# Patient Record
Sex: Female | Born: 1980 | Race: Black or African American | Hispanic: No | Marital: Single | Smoking: Never smoker
Health system: Southern US, Community
[De-identification: ages and names within clinical notes are randomized; demographics above are authoritative.]

## PROBLEM LIST (undated history)

## (undated) DIAGNOSIS — R51 Headache: Secondary | ICD-10-CM

## (undated) DIAGNOSIS — R519 Headache, unspecified: Secondary | ICD-10-CM

## (undated) DIAGNOSIS — E039 Hypothyroidism, unspecified: Secondary | ICD-10-CM

## (undated) DIAGNOSIS — G709 Myoneural disorder, unspecified: Secondary | ICD-10-CM

## (undated) DIAGNOSIS — J45909 Unspecified asthma, uncomplicated: Secondary | ICD-10-CM

## (undated) HISTORY — PX: NO PAST SURGERIES: SHX2092

---

## 2002-11-19 ENCOUNTER — Emergency Department (HOSPITAL_COMMUNITY): Admission: EM | Admit: 2002-11-19 | Discharge: 2002-11-19 | Payer: Self-pay | Admitting: Emergency Medicine

## 2003-02-25 ENCOUNTER — Emergency Department (HOSPITAL_COMMUNITY): Admission: EM | Admit: 2003-02-25 | Discharge: 2003-02-25 | Payer: Self-pay | Admitting: Emergency Medicine

## 2003-02-25 ENCOUNTER — Encounter: Payer: Self-pay | Admitting: Emergency Medicine

## 2003-03-26 ENCOUNTER — Encounter: Admission: RE | Admit: 2003-03-26 | Discharge: 2003-05-05 | Payer: Self-pay | Admitting: *Deleted

## 2004-07-07 ENCOUNTER — Other Ambulatory Visit: Admission: RE | Admit: 2004-07-07 | Discharge: 2004-07-07 | Payer: Self-pay | Admitting: Family Medicine

## 2004-08-28 ENCOUNTER — Emergency Department (HOSPITAL_COMMUNITY): Admission: EM | Admit: 2004-08-28 | Discharge: 2004-08-28 | Payer: Self-pay | Admitting: Family Medicine

## 2005-07-18 ENCOUNTER — Other Ambulatory Visit: Admission: RE | Admit: 2005-07-18 | Discharge: 2005-07-18 | Payer: Self-pay | Admitting: Family Medicine

## 2006-07-22 ENCOUNTER — Other Ambulatory Visit: Admission: RE | Admit: 2006-07-22 | Discharge: 2006-07-22 | Payer: Self-pay | Admitting: Family Medicine

## 2007-07-25 ENCOUNTER — Other Ambulatory Visit: Admission: RE | Admit: 2007-07-25 | Discharge: 2007-07-25 | Payer: Self-pay | Admitting: Family Medicine

## 2008-07-26 ENCOUNTER — Other Ambulatory Visit: Admission: RE | Admit: 2008-07-26 | Discharge: 2008-07-26 | Payer: Self-pay | Admitting: Family Medicine

## 2009-09-12 ENCOUNTER — Other Ambulatory Visit: Admission: RE | Admit: 2009-09-12 | Discharge: 2009-09-12 | Payer: Self-pay | Admitting: Family Medicine

## 2010-09-25 ENCOUNTER — Other Ambulatory Visit: Payer: Self-pay | Admitting: Family Medicine

## 2010-09-25 ENCOUNTER — Other Ambulatory Visit (HOSPITAL_COMMUNITY)
Admission: RE | Admit: 2010-09-25 | Discharge: 2010-09-25 | Disposition: A | Discharge status: Home / Self Care | Payer: Self-pay | Source: Ambulatory Visit | Attending: Family Medicine | Admitting: Family Medicine

## 2010-09-25 DIAGNOSIS — Z01419 Encounter for gynecological examination (general) (routine) without abnormal findings: Secondary | ICD-10-CM | POA: Insufficient documentation

## 2011-10-22 ENCOUNTER — Other Ambulatory Visit: Payer: Self-pay | Admitting: Family Medicine

## 2011-10-22 ENCOUNTER — Other Ambulatory Visit (HOSPITAL_COMMUNITY)
Admission: RE | Admit: 2011-10-22 | Discharge: 2011-10-22 | Disposition: A | Discharge status: Home / Self Care | Payer: BC Managed Care – PPO | Source: Ambulatory Visit | Attending: Family Medicine | Admitting: Family Medicine

## 2011-10-22 DIAGNOSIS — Z01419 Encounter for gynecological examination (general) (routine) without abnormal findings: Secondary | ICD-10-CM | POA: Insufficient documentation

## 2011-10-22 DIAGNOSIS — Z113 Encounter for screening for infections with a predominantly sexual mode of transmission: Secondary | ICD-10-CM | POA: Insufficient documentation

## 2012-10-22 ENCOUNTER — Other Ambulatory Visit: Payer: Self-pay | Admitting: Family Medicine

## 2012-10-22 ENCOUNTER — Other Ambulatory Visit (HOSPITAL_COMMUNITY)
Admission: RE | Admit: 2012-10-22 | Discharge: 2012-10-22 | Disposition: A | Discharge status: Home / Self Care | Payer: Commercial Indemnity | Source: Ambulatory Visit | Attending: Family Medicine | Admitting: Family Medicine

## 2012-10-22 DIAGNOSIS — Z113 Encounter for screening for infections with a predominantly sexual mode of transmission: Secondary | ICD-10-CM | POA: Insufficient documentation

## 2012-10-22 DIAGNOSIS — Z01419 Encounter for gynecological examination (general) (routine) without abnormal findings: Secondary | ICD-10-CM | POA: Insufficient documentation

## 2015-06-02 ENCOUNTER — Encounter (HOSPITAL_COMMUNITY): Payer: Self-pay

## 2015-06-02 ENCOUNTER — Inpatient Hospital Stay (HOSPITAL_COMMUNITY): Payer: BLUE CROSS/BLUE SHIELD

## 2015-06-02 ENCOUNTER — Inpatient Hospital Stay (HOSPITAL_COMMUNITY)
Admission: AD | Admit: 2015-06-02 | Discharge: 2015-06-02 | DRG: 778 | Payer: BLUE CROSS/BLUE SHIELD | Source: Ambulatory Visit | Attending: Obstetrics and Gynecology | Admitting: Obstetrics and Gynecology

## 2015-06-02 DIAGNOSIS — D72829 Elevated white blood cell count, unspecified: Secondary | ICD-10-CM | POA: Diagnosis present

## 2015-06-02 DIAGNOSIS — Z91040 Latex allergy status: Secondary | ICD-10-CM | POA: Diagnosis present

## 2015-06-02 DIAGNOSIS — O99513 Diseases of the respiratory system complicating pregnancy, third trimester: Secondary | ICD-10-CM | POA: Diagnosis present

## 2015-06-02 DIAGNOSIS — E039 Hypothyroidism, unspecified: Secondary | ICD-10-CM | POA: Diagnosis present

## 2015-06-02 DIAGNOSIS — Z3A28 28 weeks gestation of pregnancy: Secondary | ICD-10-CM

## 2015-06-02 DIAGNOSIS — O09513 Supervision of elderly primigravida, third trimester: Secondary | ICD-10-CM | POA: Diagnosis not present

## 2015-06-02 DIAGNOSIS — M6208 Separation of muscle (nontraumatic), other site: Secondary | ICD-10-CM

## 2015-06-02 DIAGNOSIS — O329XX0 Maternal care for malpresentation of fetus, unspecified, not applicable or unspecified: Secondary | ICD-10-CM

## 2015-06-02 DIAGNOSIS — R000 Tachycardia, unspecified: Secondary | ICD-10-CM | POA: Diagnosis present

## 2015-06-02 DIAGNOSIS — O99283 Endocrine, nutritional and metabolic diseases complicating pregnancy, third trimester: Secondary | ICD-10-CM | POA: Diagnosis present

## 2015-06-02 DIAGNOSIS — J45909 Unspecified asthma, uncomplicated: Secondary | ICD-10-CM | POA: Diagnosis not present

## 2015-06-02 DIAGNOSIS — O09529 Supervision of elderly multigravida, unspecified trimester: Secondary | ICD-10-CM

## 2015-06-02 HISTORY — DX: Unspecified asthma, uncomplicated: J45.909

## 2015-06-02 HISTORY — DX: Hypothyroidism, unspecified: E03.9

## 2015-06-02 HISTORY — DX: Myoneural disorder, unspecified: G70.9

## 2015-06-02 HISTORY — DX: Headache: R51

## 2015-06-02 HISTORY — DX: Headache, unspecified: R51.9

## 2015-06-02 LAB — URINALYSIS, ROUTINE W REFLEX MICROSCOPIC
Bilirubin Urine: NEGATIVE
GLUCOSE, UA: NEGATIVE mg/dL
HGB URINE DIPSTICK: NEGATIVE
Ketones, ur: >80 mg/dL — AB
Nitrite: NEGATIVE
PH: 6 (ref 5.0–8.0)
PROTEIN: NEGATIVE mg/dL
Specific Gravity, Urine: >1.03 — ABNORMAL HIGH (ref 1.005–1.030)

## 2015-06-02 LAB — COMPREHENSIVE METABOLIC PANEL
ALK PHOS: 93 U/L (ref 38–126)
ALT: 14 U/L (ref 14–54)
AST: 16 U/L (ref 15–41)
Albumin: 3.1 g/dL — ABNORMAL LOW (ref 3.5–5.0)
Anion gap: 9 (ref 5–15)
BILIRUBIN TOTAL: 0.7 mg/dL (ref 0.3–1.2)
CALCIUM: 9 mg/dL (ref 8.9–10.3)
CO2: 20 mmol/L — ABNORMAL LOW (ref 22–32)
CREATININE: 0.47 mg/dL (ref 0.44–1.00)
Chloride: 105 mmol/L (ref 101–111)
Glucose, Bld: 114 mg/dL — ABNORMAL HIGH (ref 65–99)
Potassium: 3.5 mmol/L (ref 3.5–5.1)
Sodium: 134 mmol/L — ABNORMAL LOW (ref 135–145)
TOTAL PROTEIN: 6.7 g/dL (ref 6.5–8.1)

## 2015-06-02 LAB — URINE MICROSCOPIC-ADD ON: RBC / HPF: NONE SEEN RBC/hpf (ref 0–5)

## 2015-06-02 LAB — WET PREP, GENITAL
SPERM: NONE SEEN
TRICH WET PREP: NONE SEEN
YEAST WET PREP: NONE SEEN

## 2015-06-02 LAB — CBC
HCT: 33.1 % — ABNORMAL LOW (ref 36.0–46.0)
Hemoglobin: 11.4 g/dL — ABNORMAL LOW (ref 12.0–15.0)
MCH: 31.1 pg (ref 26.0–34.0)
MCHC: 34.4 g/dL (ref 30.0–36.0)
MCV: 90.2 fL (ref 78.0–100.0)
PLATELETS: 202 10*3/uL (ref 150–400)
RBC: 3.67 MIL/uL — AB (ref 3.87–5.11)
RDW: 13.8 % (ref 11.5–15.5)
WBC: 19 10*3/uL — AB (ref 4.0–10.5)

## 2015-06-02 LAB — GROUP B STREP BY PCR: GROUP B STREP BY PCR: NEGATIVE

## 2015-06-02 MED ORDER — BETAMETHASONE SOD PHOS & ACET 6 (3-3) MG/ML IJ SUSP
12.0000 mg | Freq: Once | INTRAMUSCULAR | Status: DC
  Filled 2015-06-02: qty 2

## 2015-06-02 MED ORDER — DOCUSATE SODIUM 100 MG PO CAPS: 100.0000 mg | ORAL_CAPSULE | Freq: Every day | ORAL | Status: DC

## 2015-06-02 MED ORDER — CALCIUM CARBONATE ANTACID 500 MG PO CHEW: 2.0000 | CHEWABLE_TABLET | ORAL | Status: DC | PRN

## 2015-06-02 MED ORDER — LACTATED RINGERS IV SOLN: INTRAVENOUS | Status: DC

## 2015-06-02 MED ORDER — ACETAMINOPHEN 325 MG PO TABS: 650.0000 mg | ORAL_TABLET | ORAL | Status: DC | PRN

## 2015-06-02 MED ORDER — BETAMETHASONE SOD PHOS & ACET 6 (3-3) MG/ML IJ SUSP
12.0000 mg | Freq: Once | INTRAMUSCULAR | Status: AC
  Administered 2015-06-02: 12 mg via INTRAMUSCULAR
  Filled 2015-06-02: qty 2

## 2015-06-02 MED ORDER — PRENATAL MULTIVITAMIN CH: 1.0000 | ORAL_TABLET | Freq: Every day | ORAL | Status: DC

## 2015-06-02 MED ORDER — FENTANYL CITRATE (PF) 100 MCG/2ML IJ SOLN
100.0000 ug | INTRAMUSCULAR | Status: DC | PRN
  Administered 2015-06-02 (×3): 100 ug via INTRAVENOUS
  Filled 2015-06-02 (×3): qty 2

## 2015-06-02 MED ORDER — SODIUM CHLORIDE 0.9 % IV SOLN
3.0000 g | Freq: Four times a day (QID) | INTRAVENOUS | Status: DC
  Administered 2015-06-02: 3 g via INTRAVENOUS
  Filled 2015-06-02 (×4): qty 3

## 2015-06-02 MED ORDER — MAGNESIUM SULFATE 50 % IJ SOLN
2.5000 g/h | INTRAVENOUS | Status: DC
  Administered 2015-06-02: 2 g/h via INTRAVENOUS
  Filled 2015-06-02: qty 80

## 2015-06-02 MED ORDER — MAGNESIUM SULFATE BOLUS VIA INFUSION
4.0000 g | Freq: Once | INTRAVENOUS | Status: AC
  Administered 2015-06-02: 4 g via INTRAVENOUS
  Filled 2015-06-02: qty 500

## 2015-06-02 MED ORDER — MORPHINE SULFATE (PF) 4 MG/ML IV SOLN
4.0000 mg | INTRAVENOUS | Status: DC | PRN
  Administered 2015-06-02: 4 mg via INTRAVENOUS
  Filled 2015-06-02: qty 1

## 2015-06-02 MED ORDER — FENTANYL CITRATE (PF) 100 MCG/2ML IJ SOLN: 50.0000 ug | INTRAMUSCULAR | Status: DC | PRN

## 2015-06-02 MED ORDER — MORPHINE SULFATE (PF) 4 MG/ML IV SOLN: 4.0000 mg | Freq: Once | INTRAVENOUS | Status: DC

## 2015-06-02 MED ORDER — BETAMETHASONE SOD PHOS & ACET 6 (3-3) MG/ML IJ SUSP: 12.0000 mg | Freq: Once | INTRAMUSCULAR | Status: DC

## 2015-06-02 MED ORDER — LACTATED RINGERS IV BOLUS (SEPSIS)
500.0000 mL | Freq: Once | INTRAVENOUS | Status: AC
  Administered 2015-06-02: 500 mL via INTRAVENOUS

## 2015-06-02 MED ORDER — ZOLPIDEM TARTRATE 5 MG PO TABS: 5.0000 mg | ORAL_TABLET | Freq: Every evening | ORAL | Status: DC | PRN

## 2015-06-02 MED ORDER — PHENOL 1.4 % MT LIQD
1.0000 | OROMUCOSAL | Status: DC | PRN
  Administered 2015-06-02: 1 via OROMUCOSAL
  Filled 2015-06-02: qty 177

## 2015-06-02 NOTE — Progress Notes (Signed)
[facility] here to transfer patient to [facility]

## 2015-06-02 NOTE — Progress Notes (Signed)
Report called to [facility] labor and delivery charge R.N.

## 2015-06-02 NOTE — Progress Notes (Signed)
Hospital day # 0 pregnancy at [redacted]w[redacted]d--PTL, maternal tachycardia, leukocytosis.  S:  Now on Antenatal--still very uncomfortable with UCs.       Received 100 mcg Fentanyl at 0811 and 0931 with minimal benefit.      Reports "sore throat", using Ricola drops with minimal benefit.       O: BP 116/58 mmHg  Pulse 120  Temp(Src) 98.4 F (36.9 C) (Oral)  Resp 16  Ht 5\' 5"  (1.651 m)  Wt 86.637 kg (191 lb)  BMI 31.78 kg/m2  SpO2 98%      Fetal tracings:  Category 1      Contractions:   None tracing, but patient       Uterus non-tender      Extremities: no significant edema and no signs of DVT      Recheck of cervix at 0935--1 cm, 100%, vtx, ballotable, BBOW, bloody show.             Meds:  . ampicillin-sulbactam (UNASYN) IV  3 g Intravenous Q6H  . [START ON 06/03/2015] betamethasone acetate-betamethasone sodium phosphate  12 mg Intramuscular Once  . docusate sodium  100 mg Oral Daily  . prenatal multivitamin  1 tablet Oral Q1200  Received 1st dose of betamethasone at 0630 today.  A: [redacted]w[redacted]d with PTL, maternal tachycardia, leukocytosis     On magnesium  P: Consulted with Dr. Mancel Bale.      Increase magnesium to 2.5 gm/hr--re-evaluate status in next 1-2 hours s/p increase in dose.      If UCs persistent, will evaluate options of increasing mag to 3 gm/or or transfer to [place] due to NICU      bed census.       Reviewed plan with patient and partner.  They seem to understand and are in agreement.      Chloraseptic spray to bedside.      Tylenol prn  Donnel Saxon CNM, [place] 06/02/2015 9:40 AM

## 2015-06-02 NOTE — MAU Note (Signed)
Pt c/o contractions every 6 mins since 0230. Denies LOF. Having some vag bleeding when wiping-none noted on pt underwear. +FM

## 2015-06-02 NOTE — H&P (Signed)
Rachael Murphy is a 34 y.o. G1P0 at 28.1wks who presents, after phone call, for "severe abdominal pain." Patient reports pain started about 3 hours prior to coming. Patient denies recent sexual intercourse and reports active fetus. Patient reports vaginal bleeding upon wiping, but nothing on linerand denies LOF. Patient states pain is constant in lower abdominal area before intensifying and radiating to upper abdominal area.  Initially, cervix FT, 90%, with vtx presentation verified by [place].  Magnesium infusion begun in MAU.  Rapid GBS done, results pending.  Patient received 4 mg morphine with some benefit.  Recheck of cervix now 1 cm, 100%, slight BBOW, vtx ballotable.  Patient was also noted to have WBC ct of 19, with temp 99.2, and maternal tachycardia.    Hx of mild cold this week, with productive cough and body aches.  Did have flu vaccine. UA unremarkable, except for > 80 ketones and SG > 1.030.  Sent to culture.  Admitted to Antenatal Unit for continuing magnesium sulfate, completion of betamethasone course, IV ATB.  Patient Active Problem List   Diagnosis Date Noted  . Preterm labor 06/02/2015  . Hypothyroidism--no current meds 06/02/2015  . Asthma 06/02/2015  . Diastasis recti 06/02/2015  . AMA (advanced maternal age) multigravida 35+ 06/02/2015  . Latex allergy 06/02/2015    History of present pregnancy: Patient entered care at 10 weeks.   EDC of 08/24/15 was established by LMP   Anatomy scan:  19 weeks, with limited anatomy findings and a posterior placenta.   Additional [place] evaluations:   22 weeks:  Completion of anatomy   Significant prenatal events: Panorama screen WNL, AFP WNL.  Seen by Dr. Carlis Abbott, endocrinologist, for hypothyroidism, has not been on meds during pregnancy.  On Protonix for reflux.  Last evaluation:  05/18/15--BP 102/62, weight 194, last TSH at endocrinologist 05/18/15, no results in chart.  Patient Active Problem List   Diagnosis Date Noted  .  Preterm labor 06/02/2015    Chief Complaint  Patient presents with  . Contractions   HPI:  As above  OB History    Gravida Para Term Preterm AB TAB SAB Ectopic Multiple Living   1              Family History: family history is negative for Alcohol abuse, Arthritis, Asthma, Birth defects, Cancer, COPD, Depression, Diabetes, Drug abuse, Early death, Hearing loss, Heart disease, Hyperlipidemia, Hypertension, Kidney disease, Learning disabilities, Mental illness, Mental retardation, Miscarriages / Stillbirths, Stroke, Vision loss, and Varicose Veins.   Social History:  reports that she has never smoked. She does not have any smokeless tobacco history on file. She reports that she does not drink alcohol or use illicit drugs.   Prenatal Transfer Tool  Maternal Diabetes: No Genetic Screening: Normal Maternal Ultrasounds/Referrals: Normal Fetal Ultrasounds or other Referrals:  None Maternal Substance Abuse:  No Significant Maternal Medications:  None Significant Maternal Lab Results: Lab values include: Group B Strep negative:  Done 06/02/15     ROS:  Contractions, bloody show  No Known Allergies  Prenatal labs: ABO, Rh:  AB+ Antibody:  Neg Rubella:  Immune RPR:   NR HBsAg:   Neg HIV:   NR GBS:  Negative 06/02/15 Sickle cell/Hgb electrophoresis:  AA GC:  Negative 01/26/15, pending 06/02/15 Chlamydia:  Negative 01/26/15, pending 06/02/15 Genetic screenings:  Normal 1st trimester and AFP Glucola: Not done yet Other:   TSH 4.550 01/26/15, f/u testing at endocrinologist Hgb 13 at [place]  Vitals:   06/02/15 0706 06/02/15 0711 06/02/15 0713 06/02/15 0747  BP:   134/67 123/74  Pulse: 123 123 121 120  Temp:    98.4 F (36.9 C)  TempSrc:    Axillary  Resp:   18 20  SpO2: 97% 97% 97%     Lab Results Last 24 Hours    Results for orders placed or performed during the hospital encounter of 06/02/15 (from the past 24 hour(s))   Wet prep, genital Status: Abnormal   Collection Time: 06/02/15 5:55 AM  Result Value Ref Range   Yeast Wet Prep HPF POC NONE SEEN NONE SEEN   Trich, Wet Prep NONE SEEN NONE SEEN   Clue Cells Wet Prep HPF POC PRESENT (A) NONE SEEN   WBC, Wet Prep HPF POC MANY (A) NONE SEEN   Sperm NONE SEEN   Comprehensive metabolic panel Status: Abnormal   Collection Time: 06/02/15 6:07 AM  Result Value Ref Range   Sodium 134 (L) 135 - 145 mmol/L   Potassium 3.5 3.5 - 5.1 mmol/L   Chloride 105 101 - 111 mmol/L   CO2 20 (L) 22 - 32 mmol/L   Glucose, Bld 114 (H) 65 - 99 mg/dL   BUN <5 (L) 6 - 20 mg/dL   Creatinine, Ser 0.47 0.44 - 1.00 mg/dL   Calcium 9.0 8.9 - 10.3 mg/dL   Total Protein 6.7 6.5 - 8.1 g/dL   Albumin 3.1 (L) 3.5 - 5.0 g/dL   AST 16 15 - 41 U/L   ALT 14 14 - 54 U/L   Alkaline Phosphatase 93 38 - 126 U/L   Total Bilirubin 0.7 0.3 - 1.2 mg/dL   GFR calc non Af Amer >60 >60 mL/min   GFR calc Af Amer >60 >60 mL/min   Anion gap 9 5 - 15  CBC Status: Abnormal   Collection Time: 06/02/15 6:07 AM  Result Value Ref Range   WBC 19.0 (H) 4.0 - 10.5 K/uL   RBC 3.67 (L) 3.87 - 5.11 MIL/uL   Hemoglobin 11.4 (L) 12.0 - 15.0 g/dL   HCT 33.1 (L) 36.0 - 46.0 %   MCV 90.2 78.0 - 100.0 fL   MCH 31.1 26.0 - 34.0 pg   MCHC 34.4 30.0 - 36.0 g/dL   RDW 13.8 11.5 - 15.5 %   Platelets 202 150 - 400 K/uL  Urinalysis, Routine w reflex microscopic (not at [facility]) Status: Abnormal   Collection Time: 06/02/15 6:45 AM  Result Value Ref Range   Color, Urine YELLOW YELLOW   APPearance CLEAR CLEAR   Specific Gravity, Urine >1.030 (H) 1.005 - 1.030   pH 6.0 5.0 - 8.0   Glucose, UA NEGATIVE NEGATIVE mg/dL   Hgb urine dipstick NEGATIVE NEGATIVE   Bilirubin Urine NEGATIVE NEGATIVE   Ketones, ur >80 (A) NEGATIVE  mg/dL   Protein, ur NEGATIVE NEGATIVE mg/dL   Nitrite NEGATIVE NEGATIVE   Leukocytes, UA TRACE (A) NEGATIVE  Urine microscopic-add on Status: Abnormal   Collection Time: 06/02/15 6:45 AM  Result Value Ref Range   Squamous Epithelial / LPF 0-5 (A) NONE SEEN   WBC, UA 0-5 0 - 5 WBC/hpf   RBC / HPF NONE SEEN 0 - 5 RBC/hpf   Bacteria, UA RARE (A) NONE SEEN      Physical Exam on admission: Patient visuably uncomfortable upon provider entrance into room.  Sterile Speculum Exam: -Vaginal Vault: Moderate amt thin white discharge-wet prep collected -Cervix: Appears closed Bimanual Exam:FT/90/-1,-2  Repeat exam at 0745--loose  1 cm, 100%, vtx, ballotable.   FHR: Category 1 UC:  Palpated and observed q 3-4 min, moderate  [place]:  Vtx, EFW 2+13, 58%ile, AFI 9.74, 8%ile, HC 8%ile, posterior placenta, cervical length 1.6.  ED Course  Assessment: IUP at 28.1wks Cat I FT Preterm Labor Elevated WBC, low-grade fever, tachycardia  Plan: Admit to Antenatal Unit for PTL care per consult with Dr. Mancel Bale Continue magnesium--bolus currently infusing. Unasyn 3 gm IV q 6 hours . Fentanyl IV for pain Rapid GBS pending. Repeat cervical exam at 9am. Complete betamethasone course at 0636 on 06/03/15. NICU MD OK'd patient admission, but if patient becomes more unstable, may need transfer to [facility] due to NICU census.  Patient aware of this and understands.                 Donnel Saxon, CNM, [place] 06/02/2015, 7:55 AM   Addendum: Rechecked patient x 2 during the am. Cervix has remained 1 cm, 100%, but vtx lower in pelvis, BBOW, bloody show.  Continues to contract q 4-5 min, unresponsive to increasing mag dose or IV pain med. Filed Vitals:   06/02/15 1004 06/02/15 1111 06/02/15 1201 06/02/15 1307  BP: 121/67 142/77 105/62 118/71  Pulse: 123 123 113 108  Temp: 98.3 F (36.8 C)   98.9 F (37.2 C)  TempSrc: Oral   Axillary  Resp: 20 22 20 18    Height:      Weight:      SpO2:       Category 1 FHR GBS negative.  Consulted with Dr. Mancel Bale. Plan transfer to [facility]. Call to Dr. Nelda Severe, MFM--transfer accepted. Patient to be transported to [facility] via [place]. EMTALA forms completed. Patient and family agreeable to transfer.  Donnel Saxon, CNM  06/02/15 1p

## 2015-06-02 NOTE — MAU Provider Note (Signed)
History    Rachael Murphy is a 34 y.o. G1P0 at 28.1wks who presents, after phone call, for "severe abdominal pain."  Patient reports pain started about 3 hours prior to coming.  Patient denies recent sexual intercourse and reports active fetus.  Patient reports vaginal bleeding upon wiping, but nothing on linerand denies LOF.  Patient states pain is constant in lower abdominal area before intensifying and radiating to upper abdominal area.   Patient Active Problem List   Diagnosis Date Noted  . Preterm labor 06/02/2015    Chief Complaint  Patient presents with  . Contractions   HPI  OB History    Gravida Para Term Preterm AB TAB SAB Ectopic Multiple Living   1               No past medical history on file.  No past surgical history on file.  No family history on file.  Social History  Substance Use Topics  . Smoking status: Not on file  . Smokeless tobacco: Not on file  . Alcohol Use: Not on file    Allergies: Allergies not on file  No prescriptions prior to admission    ROS  See HPI Above Physical Exam   Blood pressure 121/74, pulse 130, temperature 99.2 F (37.3 C), temperature source Oral, resp. rate 18, SpO2 98 %.  Results for orders placed or performed during the hospital encounter of 06/02/15 (from the past 24 hour(s))  Wet prep, genital     Status: Abnormal   Collection Time: 06/02/15  5:55 AM  Result Value Ref Range   Yeast Wet Prep HPF POC NONE SEEN NONE SEEN   Trich, Wet Prep NONE SEEN NONE SEEN   Clue Cells Wet Prep HPF POC PRESENT (A) NONE SEEN   WBC, Wet Prep HPF POC MANY (A) NONE SEEN   Sperm NONE SEEN   Comprehensive metabolic panel     Status: Abnormal   Collection Time: 06/02/15  6:07 AM  Result Value Ref Range   Sodium 134 (L) 135 - 145 mmol/L   Potassium 3.5 3.5 - 5.1 mmol/L   Chloride 105 101 - 111 mmol/L   CO2 20 (L) 22 - 32 mmol/L   Glucose, Bld 114 (H) 65 - 99 mg/dL   BUN <5 (L) 6 - 20 mg/dL   Creatinine, Ser 0.47 0.44 - 1.00  mg/dL   Calcium 9.0 8.9 - 10.3 mg/dL   Total Protein 6.7 6.5 - 8.1 g/dL   Albumin 3.1 (L) 3.5 - 5.0 g/dL   AST 16 15 - 41 U/L   ALT 14 14 - 54 U/L   Alkaline Phosphatase 93 38 - 126 U/L   Total Bilirubin 0.7 0.3 - 1.2 mg/dL   GFR calc non Af Amer >60 >60 mL/min   GFR calc Af Amer >60 >60 mL/min   Anion gap 9 5 - 15  CBC     Status: Abnormal   Collection Time: 06/02/15  6:07 AM  Result Value Ref Range   WBC 19.0 (H) 4.0 - 10.5 K/uL   RBC 3.67 (L) 3.87 - 5.11 MIL/uL   Hemoglobin 11.4 (L) 12.0 - 15.0 g/dL   HCT 33.1 (L) 36.0 - 46.0 %   MCV 90.2 78.0 - 100.0 fL   MCH 31.1 26.0 - 34.0 pg   MCHC 34.4 30.0 - 36.0 g/dL   RDW 13.8 11.5 - 15.5 %   Platelets 202 150 - 400 K/uL  Urinalysis, Routine w reflex microscopic (not at [facility])  Status: Abnormal   Collection Time: 06/02/15  6:45 AM  Result Value Ref Range   Color, Urine YELLOW YELLOW   APPearance CLEAR CLEAR   Specific Gravity, Urine >1.030 (H) 1.005 - 1.030   pH 6.0 5.0 - 8.0   Glucose, UA NEGATIVE NEGATIVE mg/dL   Hgb urine dipstick NEGATIVE NEGATIVE   Bilirubin Urine NEGATIVE NEGATIVE   Ketones, ur >80 (A) NEGATIVE mg/dL   Protein, ur NEGATIVE NEGATIVE mg/dL   Nitrite NEGATIVE NEGATIVE   Leukocytes, UA TRACE (A) NEGATIVE  Urine microscopic-add on     Status: Abnormal   Collection Time: 06/02/15  6:45 AM  Result Value Ref Range   Squamous Epithelial / LPF 0-5 (A) NONE SEEN   WBC, UA 0-5 0 - 5 WBC/hpf   RBC / HPF NONE SEEN 0 - 5 RBC/hpf   Bacteria, UA RARE (A) NONE SEEN    Physical Exam Patient visuably uncomfortable upon provider entrance into room.   Sterile Speculum Exam: -Vaginal Vault: Moderate amt thin white discharge-wet prep collected -Cervix: Appears closed Bimanual Exam:FT/90/-1,-2   FHR: 125 bpm, Mod Var, -Decels, +Accels UC: Unable to trace or palpate ED Course  Assessment: IUP at 28.1wks Cat I FT Preterm Labor  Plan: -PE as above -BMZ now -Start IV with LR bolus f/b continuous infusion -fFN  deferred due to VB -Morphine IV for pain -BS [place] for fetal presentation -Dr. Scotty Court consulted and advised as below: -Labs: CBC, CMP, UA -Complete US with AFI, EFW, Cervical Length, and placenta location  Follow Up [id number]) -Results as above -Confirmed Vertex -EFW 2lbs 13 oz -Tracing and palpating some mild contractions -Dr. Scotty Court consulted and advised starting MgSO4 -Oncoming provider, Windy Kalata CNM, updated on patient status  Maryann Conners CNM, MSN 06/02/2015 6:13 AM

## 2015-06-03 LAB — GC/CHLAMYDIA PROBE AMP (~~LOC~~) NOT AT ARMC
Chlamydia: NEGATIVE
NEISSERIA GONORRHEA: NEGATIVE

## 2015-06-03 LAB — CULTURE, OB URINE

## 2015-07-06 NOTE — Discharge Summary (Signed)
Antenatal Discharge Summary  Rachael Murphy  DOB:    11-24-1980 MRN:    [id number] CSN:    [id number]  Date of admission:                  06/02/15  Date of discharge:                   06/02/15--Transferred to [facility]  Procedures this admission:   Magnesium sulfate, initiation of betamethasone course (1st dose), IV ATB.  History of Present Illness:  [name] is a 35 y.o. female, G1P0, who presented at 41w0dweeks gestation in active preterm labor. [facility] She was admitted for premature labor. Her pregnancy has been complicated by:  Patient Active Problem List   Diagnosis Date Noted  . Preterm labor 06/02/2015  . Hypothyroidism--no current meds 06/02/2015  . Asthma 06/02/2015  . Diastasis recti 06/02/2015  . AMA (advanced maternal age) multigravida 35+ 06/02/2015  . Latex allergy 06/02/2015     Hospital Course:   Admitting Dx:   IUP at 2851/7 weeks, active preterm labor, elevated WBC, fever, maternal tachycardia   Additional Information:   Rachael Murphy a 35y.o. G1P0 at 28.1wks [facility] Patient reportes pain started about 3 hours prior to coming. Patient denied recent sexual intercourse and reports active fetus. Patient reported vaginal bleeding upon wiping, but nothing on linerand denies LOF. Patient stated pain is constant in lower abdominal area before intensifying and radiating to upper abdominal area.  Initially, cervix FT, 90%, with vtx presentation verified by [place] Magnesium infusion begun in MAU. Rapid GBS done, results pending. Patient received 4 mg morphine with some benefit. Recheck of cervix later in the am 1 cm, 100%, slight BBOW, vtx ballotable. Patient was also noted to have WBC ct of 19, with temp 99.2, and maternal tachycardia.   Hx of mild  cold this week, with productive cough and body aches. Did have flu vaccine. UA unremarkable, except for > 80 ketones and SG > 1.030. Sent to culture.  Admitted to Antenatal Unit for IV hydration, Magnesium Sulfate course, betamethasone regimen, ATB, received Fentanyl.  Rechecked patient x 2 during the am. Cervix remained 1 cm, 100%, but vtx lower in pelvis, BBOW, bloody show.  Continued to contract q 4-5 min, unresponsive to increasing mag dose or IV pain med.  Due to high census in NICU, [facility] NNelda Severeas accepting MD.   Discharge Diagnoses: IUP at 28 1/7 weeks, active preterm labor, transferred to another facility due to no beds in NICU.   Hemoglobin Results:  CBC Latest Ref Rng 06/02/2015  WBC 4.0 - 10.5 K/uL 19.0(H)  Hemoglobin 12.0 - 15.0 g/dL 11.4(L)  Hematocrit 36.0 - 46.0 % 33.1(L)  Platelets 150 - 400 K/uL 202     Discharge Physical Exam:   General: alert Chest clear Heart RRR without murmur, HR 120 Abd gravid, NT Pelvic--cervix loose 1 cm, BBOW, 100%, vtx ballotable. Ext WNL  FHR Category 1 UCs q 3-4 min, moderate.  Recent Results (from the past 2160 hour(s))  [facility]lam[facility]    Status: None   Collection Time: 06/02/15 12:00 AM  Result Value Ref Range   Chlamydia Negative     Comment: Normal  Reference Range - Negative   Neisseria gonorrhea Negative     Comment: Normal Reference Range - Negative  Wet prep, genital     Status: Abnormal   Collection Time: 06/02/15  5:55 AM  Result Value Ref Range   Yeast Wet Prep HPF POC NONE SEEN NONE SEEN   Trich, Wet Prep NONE SEEN NONE SEEN   Clue Cells Wet Prep HPF POC PRESENT (A) NONE SEEN   WBC, Wet Prep HPF POC MANY (A) NONE SEEN    Comment: MODERATE BACTERIA SEEN   Sperm NONE SEEN   Comprehensive metabolic panel     Status: Abnormal   Collection Time: 06/02/15  6:07 AM  Result Value Ref Range   Sodium 134 (L) 135 - 145  mmol/L   Potassium 3.5 3.5 - 5.1 mmol/L   Chloride 105 101 - 111 mmol/L   CO2 20 (L) 22 - 32 mmol/L   Glucose, Bld 114 (H) 65 - 99 mg/dL   BUN <5 (L) 6 - 20 mg/dL    Comment: REPEATED TO VERIFY   Creatinine, Ser 0.47 0.44 - 1.00 mg/dL   Calcium 9.0 8.9 - 10.3 mg/dL   Total Protein 6.7 6.5 - 8.1 g/dL   Albumin 3.1 (L) 3.5 - 5.0 g/dL   AST 16 15 - 41 U/L   ALT 14 14 - 54 U/L   Alkaline Phosphatase 93 38 - 126 U/L   Total Bilirubin 0.7 0.3 - 1.2 mg/dL   GFR calc non Af Amer >60 >60 mL/min   GFR calc Af Amer >60 >60 mL/min    Comment: (NOTE) The eGFR has been calculated using the CKD EPI equation. This calculation has not been validated in all clinical situations. eGFR's persistently <60 mL/min signify possible Chronic Kidney Disease.    Anion gap 9 5 - 15  CBC     Status: Abnormal   Collection Time: 06/02/15  6:07 AM  Result Value Ref Range   WBC 19.0 (H) 4.0 - 10.5 K/uL   RBC 3.67 (L) 3.87 - 5.11 MIL/uL   Hemoglobin 11.4 (L) 12.0 - 15.0 g/dL   HCT 33.1 (L) 36.0 - 46.0 %   MCV 90.2 78.0 - 100.0 fL   MCH 31.1 26.0 - 34.0 pg   MCHC 34.4 30.0 - 36.0 g/dL   RDW 13.8 11.5 - 15.5 %   Platelets 202 150 - 400 K/uL  Urinalysis, Routine w reflex microscopic (not at [facility])     Status: Abnormal   Collection Time: 06/02/15  6:45 AM  Result Value Ref Range   Color, Urine YELLOW YELLOW   APPearance CLEAR CLEAR   Specific Gravity, Urine >1.030 (H) 1.005 - 1.030   pH 6.0 5.0 - 8.0   Glucose, UA NEGATIVE NEGATIVE mg/dL   Hgb urine dipstick NEGATIVE NEGATIVE   Bilirubin Urine NEGATIVE NEGATIVE   Ketones, ur >80 (A) NEGATIVE mg/dL   Protein, ur NEGATIVE NEGATIVE mg/dL   Nitrite NEGATIVE NEGATIVE   Leukocytes, UA TRACE (A) NEGATIVE  Culture, OB Urine     Status: None   Collection Time: 06/02/15  6:45 AM  Result Value Ref Range   Specimen Description URINE, CLEAN CATCH    Special Requests NONE    Culture      MULTIPLE SPECIES PRESENT, SUGGEST RECOLLECTION NO GROUP B STREP  (S.AGALACTIAE) ISOLATED Performed at [facility]    Report Status 06/03/2015 FINAL   Urine microscopic-add on     Status: Abnormal   Collection Time: 06/02/15  6:45  AM  Result Value Ref Range   Squamous Epithelial / LPF 0-5 (A) NONE SEEN   WBC, UA 0-5 0 - 5 WBC/hpf   RBC / HPF NONE SEEN 0 - 5 RBC/hpf   Bacteria, UA RARE (A) NONE SEEN  Group B strep by PCR     Status: None   Collection Time: 06/02/15  7:04 AM  Result Value Ref Range   Group B strep by PCR NEGATIVE NEGATIVE    Discharge Information:  Transferred to [facility] for further treatment of PTL.  Discharge to: [facility], for transfer to [facility].      Donnel Saxon CNM 06/02/15 1:30p

## 2016-04-06 ENCOUNTER — Encounter (HOSPITAL_COMMUNITY): Payer: Self-pay

## 2016-12-21 ENCOUNTER — Other Ambulatory Visit: Payer: Self-pay | Admitting: Family Medicine

## 2016-12-21 DIAGNOSIS — N611 Abscess of the breast and nipple: Secondary | ICD-10-CM

## 2016-12-24 ENCOUNTER — Ambulatory Visit
Admission: RE | Admit: 2016-12-24 | Discharge: 2016-12-24 | Disposition: A | Discharge status: Home / Self Care | Payer: BLUE CROSS/BLUE SHIELD | Source: Ambulatory Visit | Attending: Family Medicine | Admitting: Family Medicine

## 2016-12-24 ENCOUNTER — Other Ambulatory Visit: Payer: Self-pay | Admitting: Family Medicine

## 2016-12-24 ENCOUNTER — Encounter: Payer: Self-pay | Admitting: Radiology

## 2016-12-24 DIAGNOSIS — N611 Abscess of the breast and nipple: Secondary | ICD-10-CM

## 2016-12-28 ENCOUNTER — Ambulatory Visit
Admission: RE | Admit: 2016-12-28 | Discharge: 2016-12-28 | Disposition: A | Discharge status: Home / Self Care | Payer: BLUE CROSS/BLUE SHIELD | Source: Ambulatory Visit | Attending: Family Medicine | Admitting: Family Medicine

## 2016-12-28 ENCOUNTER — Other Ambulatory Visit: Payer: Self-pay | Admitting: Family Medicine

## 2016-12-28 DIAGNOSIS — N611 Abscess of the breast and nipple: Secondary | ICD-10-CM

## 2017-01-07 ENCOUNTER — Other Ambulatory Visit: Payer: Self-pay | Admitting: Family Medicine

## 2017-01-07 ENCOUNTER — Ambulatory Visit
Admission: RE | Admit: 2017-01-07 | Discharge: 2017-01-07 | Disposition: A | Discharge status: Home / Self Care | Payer: BLUE CROSS/BLUE SHIELD | Source: Ambulatory Visit | Attending: Family Medicine | Admitting: Family Medicine

## 2017-01-07 DIAGNOSIS — N611 Abscess of the breast and nipple: Secondary | ICD-10-CM

## 2017-01-14 ENCOUNTER — Other Ambulatory Visit: Payer: Self-pay | Admitting: Family Medicine

## 2017-01-14 ENCOUNTER — Ambulatory Visit
Admission: RE | Admit: 2017-01-14 | Discharge: 2017-01-14 | Disposition: A | Discharge status: Home / Self Care | Payer: BLUE CROSS/BLUE SHIELD | Source: Ambulatory Visit | Attending: Family Medicine | Admitting: Family Medicine

## 2017-01-14 ENCOUNTER — Ambulatory Visit: Admission: RE | Admit: 2017-01-14 | Payer: BLUE CROSS/BLUE SHIELD | Source: Ambulatory Visit

## 2017-01-14 DIAGNOSIS — N611 Abscess of the breast and nipple: Secondary | ICD-10-CM

## 2017-01-21 ENCOUNTER — Other Ambulatory Visit: Payer: Self-pay | Admitting: Family Medicine

## 2017-01-21 ENCOUNTER — Ambulatory Visit
Admission: RE | Admit: 2017-01-21 | Discharge: 2017-01-21 | Disposition: A | Discharge status: Home / Self Care | Payer: BLUE CROSS/BLUE SHIELD | Source: Ambulatory Visit | Attending: Family Medicine | Admitting: Family Medicine

## 2017-01-21 ENCOUNTER — Ambulatory Visit: Admission: RE | Admit: 2017-01-21 | Payer: BLUE CROSS/BLUE SHIELD | Source: Ambulatory Visit

## 2017-01-21 DIAGNOSIS — N611 Abscess of the breast and nipple: Secondary | ICD-10-CM

## 2017-02-21 ENCOUNTER — Ambulatory Visit
Admission: RE | Admit: 2017-02-21 | Discharge: 2017-02-21 | Disposition: A | Discharge status: Home / Self Care | Payer: BLUE CROSS/BLUE SHIELD | Source: Ambulatory Visit | Attending: Family Medicine | Admitting: Family Medicine

## 2017-02-21 ENCOUNTER — Ambulatory Visit: Payer: BLUE CROSS/BLUE SHIELD

## 2017-02-21 DIAGNOSIS — N611 Abscess of the breast and nipple: Secondary | ICD-10-CM

## 2019-06-24 ENCOUNTER — Other Ambulatory Visit: Payer: Self-pay | Admitting: Nurse Practitioner

## 2019-07-03 ENCOUNTER — Other Ambulatory Visit: Payer: Self-pay | Admitting: Nurse Practitioner

## 2019-07-03 DIAGNOSIS — N910 Primary amenorrhea: Secondary | ICD-10-CM

## 2019-07-03 DIAGNOSIS — R7989 Other specified abnormal findings of blood chemistry: Secondary | ICD-10-CM

## 2019-07-24 ENCOUNTER — Other Ambulatory Visit: Payer: Self-pay

## 2019-07-24 ENCOUNTER — Ambulatory Visit
Admission: RE | Admit: 2019-07-24 | Discharge: 2019-07-24 | Disposition: A | Discharge status: Home / Self Care | Payer: BC Managed Care – PPO | Source: Ambulatory Visit | Attending: Nurse Practitioner | Admitting: Nurse Practitioner

## 2019-07-24 DIAGNOSIS — R7989 Other specified abnormal findings of blood chemistry: Secondary | ICD-10-CM

## 2019-07-24 DIAGNOSIS — N910 Primary amenorrhea: Secondary | ICD-10-CM

## 2019-07-24 MED ORDER — GADOBENATE DIMEGLUMINE 529 MG/ML IV SOLN
9.0000 mL | Freq: Once | INTRAVENOUS | Status: AC | PRN
  Administered 2019-07-24: 9 mL via INTRAVENOUS

## 2019-09-07 ENCOUNTER — Other Ambulatory Visit: Payer: Self-pay

## 2019-09-09 ENCOUNTER — Other Ambulatory Visit: Payer: Self-pay

## 2019-09-09 ENCOUNTER — Encounter: Payer: Self-pay | Admitting: Internal Medicine

## 2019-09-09 ENCOUNTER — Ambulatory Visit: Payer: BC Managed Care – PPO | Admitting: Internal Medicine

## 2019-09-09 VITALS — BP 118/72 | HR 76 | Temp 98.2°F | Ht 64.0 in | Wt 207.8 lb

## 2019-09-09 DIAGNOSIS — D352 Benign neoplasm of pituitary gland: Secondary | ICD-10-CM | POA: Diagnosis not present

## 2019-09-09 LAB — TSH: TSH: 2.71 u[IU]/mL (ref 0.35–4.50)

## 2019-09-09 LAB — BASIC METABOLIC PANEL
BUN: 15 mg/dL (ref 6–23)
CO2: 30 mEq/L (ref 19–32)
Calcium: 9.7 mg/dL (ref 8.4–10.5)
Chloride: 104 mEq/L (ref 96–112)
Creatinine, Ser: 0.77 mg/dL (ref 0.40–1.20)
GFR: 100.77 mL/min (ref >60.00)
Glucose, Bld: 126 mg/dL — ABNORMAL HIGH (ref 70–99)
Potassium: 4.2 mEq/L (ref 3.5–5.1)
Sodium: 139 mEq/L (ref 135–145)

## 2019-09-09 LAB — CORTISOL: Cortisol, Plasma: 9.6 ug/dL

## 2019-09-09 LAB — FOLLICLE STIMULATING HORMONE: FSH: 6.8 m[IU]/mL

## 2019-09-09 LAB — LUTEINIZING HORMONE: LH: 5.41 m[IU]/mL

## 2019-09-09 LAB — T4, FREE: Free T4: 0.84 ng/dL (ref 0.60–1.60)

## 2019-09-09 NOTE — Progress Notes (Signed)
Name: Rachael Murphy  MRN/ DOB: [id number], 23-Sep-1980    Age/ Sex: 39 y.o., female    PCP: Donald Prose, MD   Reason for Endocrinology Evaluation:  Pituitary adenoma     Date of Initial Endocrinology Evaluation: 09/10/2019     HPI: [name] is a 39 y.o. female with a past medical history of seasonal allergies.  The patient presented for initial endocrinology clinic visit on 09/10/2019 for consultative assistance with her pituitary adenoma .   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  She noted irregular menstruation since having her child in 2016 . She was seen by a different GYn until she established with new Gyn in 05/2019  Has noted galactorrhea in the past ~ 1 yrs ago  Has chronic migraine headaches, no changes in those  Has noted visual changes in the past 6 years especially at  night , but no loss of vision or double vision  Does not recall last time she had an eye exam   She did not notice any excessive sweating except at night  She has not noted any recent change in shoe size other then when she was pregnant Denies constipation or diarrhea  New stretch marks on flanks Has noted weight gain  Over the years   Maternal GM thyroid disease   HISTORY:  Past Medical History:  Past Medical History:  Diagnosis Date  . Asthma   . Headache   . Hypothyroidism   . Neuromuscular disorder ([place])    scoliosis   Past Surgical History:  Past Surgical History:  Procedure Laterality Date  . NO PAST SURGERIES        Social History:  reports that she has never smoked. She uses smokeless tobacco. She reports that she does not drink alcohol or use drugs.  Family History: family history includes Thyroid disease in her mother.   HOME MEDICATIONS: Allergies as of 09/09/2019   No Known Allergies     Medication List       Accurate as of September 09, 2019 11:59 PM. If you have any questions, ask  your nurse or doctor.        albuterol 108 (90 Base) MCG/ACT inhaler Commonly known as: VENTOLIN HFA Inhale 1-2 puffs into the lungs every 6 (six) hours as needed for wheezing or shortness of breath.   ELDERBERRY PO Take by mouth.   pantoprazole 40 MG tablet Commonly known as: PROTONIX Take 40 mg by mouth daily.   prenatal multivitamin Tabs tablet Take 1 tablet by mouth daily at 12 noon.   VITA-C PO Take by mouth.   VITAMIN E COMPLETE PO Take by mouth.         REVIEW OF SYSTEMS: A comprehensive ROS was conducted with the patient and is negative except as per HPI and below:  ROS     OBJECTIVE:  VS: BP 118/72 (BP Location: Left Arm, Patient Position: Sitting, Cuff Size: Large)   Pulse 76   Temp 98.2 F (36.8 C)   Ht 5\' 4"  (1.626 m)   Wt 207 lb 12.8 oz (94.3 kg)   LMP 07/30/2019 (Exact Date)   SpO2 98%   BMI 35.67 kg/m    Wt Readings from Last 3 Encounters:  09/09/19 207 lb 12.8 oz (94.3 kg)  06/02/15 191 lb (86.6 kg)     EXAM: General: Pt appears well and is in NAD  Eyes: External eye exam normal without stare,  lid lag or exophthalmos.  EOM intact.  Ears, Nose, Throat: Hearing: Grossly intact bilaterally Dental: Good dentition  Throat: Clear without mass, erythema or exudate  Neck: General: Supple without adenopathy. Thyroid: Thyroid size normal.  No goiter or nodules appreciated. No thyroid bruit.  Lungs: Clear with good BS bilat with no rales, rhonchi, or wheezes  Heart: Auscultation: RRR.  Abdomen: Normoactive bowel sounds, soft, nontender, without masses or organomegaly palpable  Extremities:  BL LE: No pretibial edema normal ROM and strength.  Skin: Hair: Texture and amount normal with gender appropriate distribution Skin Inspection: No rashes, acanthosis nigricans/skin tags. No lipohypertrophy Skin Palpation: Skin temperature, texture, and thickness normal to palpation  Neuro: Cranial nerves: II - XII grossly intact  Motor: Normal strength  throughout DTRs: 2+ and symmetric in UE without delay in relaxation phase  Mental Status: Judgment, insight: Intact Orientation: Oriented to time, place, and person Mood and affect: No depression, anxiety, or agitation     DATA REVIEWED: Results for MARLIENE, GIFFEN (MRN [id number]) as of 09/10/2019 10:14  Ref. Range 09/09/2019 10:12  Sodium Latest Ref Range: 135 - 145 mEq/L 139  Potassium Latest Ref Range: 3.5 - 5.1 mEq/L 4.2  Chloride Latest Ref Range: 96 - 112 mEq/L 104  CO2 Latest Ref Range: 19 - 32 mEq/L 30  Glucose Latest Ref Range: 70 - 99 mg/dL 126 (H)  BUN Latest Ref Range: 6 - 23 mg/dL 15  Creatinine Latest Ref Range: 0.40 - 1.20 mg/dL 0.77  Calcium Latest Ref Range: 8.4 - 10.5 mg/dL 9.7  GFR Latest Ref Range: >60.00 mL/min 100.77  Cortisol, Plasma Latest Units: ug/dL 9.6  LH Latest Units: mIU/mL 5.41  FSH Latest Units: mIU/ML 6.8  Prolactin Latest Units: ng/mL 73.7 (H)  Estradiol Latest Units: pg/mL 52  TSH Latest Ref Range: 0.35 - 4.50 uIU/mL 2.71  T4,Free(Direct) Latest Ref Range: 0.60 - 1.60 ng/dL 0.84       06/18/2019   Prolactin 73.15 and ng/mL TSH 3.34 uIU/mL Total testosterone 13.1 ng/dL  FSH 6 mIU/mL  LH 8  MIU/mL   MRI 07/24/2019    Brain: Along the base of the pituitary gland is a relatively hypoenhancing mass which measures 9 by 5 by 11 mm. No evident extension into the cavernous sinus region and the suprasellar cistern and optic chiasm have a normal appearance.  The brain itself shows no infarct, hemorrhage, hydrocephalus, or mass. Brain volume is normal and there is no white matter disease.  Vascular: Normal  Skull and upper cervical spine: Normal marrow signal  Sinuses/Orbits: Small retention cysts in the right maxillary sinus.  IMPRESSION: 11 x 9 x 5 mm pituitary mass/adenoma.  No extra-sellar extension. ASSESSMENT/PLAN/RECOMMENDATIONS:   1. Pituitary Macroadenoma:  - No local symptoms of pituitary macroadenoma - Pt advised  to have a visual field testing through opthalmology, no compromise of optic chiasm at this time  - Will repeat pituitary imaging in 6-12 months  - TFT, cortisol levels are normal. IGF-1  -pending   2.  Prolactinoma  - Will start Cabergoline  - Cautioned against side effects, mainly GI side effects  Medications : Cabergoline 0.5 mg twice weekly   Signed electronically by: Mack Guise, MD  [facility] Endocrinology  [facility] [address] [place], [place] [address] Phone: [phone] FAX: [phone]   CC: Donald Prose, [place] [address] Phone: [phone] Fax: [phone]   Return to Endocrinology clinic as below: Future Appointments  Date Time Provider [place]  12/15/2019  9:10 AM Georgana Romain, Melanie Crazier, MD [facility] None

## 2019-09-09 NOTE — Patient Instructions (Signed)
-   Please see an eye doctor for a visual field testing for pituitary macroadenoma (1.1 cm ) , this is to have as a baseline.   - Labs today

## 2019-09-10 ENCOUNTER — Telehealth: Payer: Self-pay | Admitting: Internal Medicine

## 2019-09-10 DIAGNOSIS — D352 Benign neoplasm of pituitary gland: Secondary | ICD-10-CM | POA: Insufficient documentation

## 2019-09-10 MED ORDER — CABERGOLINE 0.5 MG PO TABS: 0.5000 mg | ORAL_TABLET | ORAL | 3 refills | Status: DC

## 2019-09-10 NOTE — Telephone Encounter (Addendum)
Attempted to call the pt but no answer left a message to call back     The pt continues to have elevated prolactin level, rest of the results are normal  I would like to suggest starting Cabergoline at one  a tablet TWICE a WEEK     Abby Nena Jordan, MD  [facility] Endocrinology  [address] [place], [place] [address] Phone: [phone] FAX: [phone]    Addendum: Patient did not call back by 09/14/2019, a letter will be mailed to the patient with above instructions

## 2019-09-14 ENCOUNTER — Encounter: Payer: Self-pay | Admitting: Internal Medicine

## 2019-09-14 LAB — ACTH: C206 ACTH: 47 pg/mL (ref 6–50)

## 2019-09-14 LAB — INSULIN-LIKE GROWTH FACTOR
IGF-I, LC/MS: 93 ng/mL (ref 53–331)
Z-Score (Female): -0.9 SD (ref ?–2.0)

## 2019-09-14 LAB — EXTRA SPECIMEN

## 2019-09-14 LAB — ESTRADIOL: Estradiol: 52 pg/mL

## 2019-09-14 LAB — PROLACTIN: Prolactin: 73.7 ng/mL — ABNORMAL HIGH

## 2019-09-14 NOTE — Telephone Encounter (Signed)
Pt aware of results and instructions.  

## 2019-11-12 ENCOUNTER — Other Ambulatory Visit: Payer: Self-pay | Admitting: Nurse Practitioner

## 2019-11-16 ENCOUNTER — Other Ambulatory Visit: Payer: Self-pay | Admitting: Nurse Practitioner

## 2019-11-16 DIAGNOSIS — Z3040 Encounter for surveillance of contraceptives, unspecified: Secondary | ICD-10-CM

## 2019-11-30 ENCOUNTER — Ambulatory Visit
Admission: RE | Admit: 2019-11-30 | Discharge: 2019-11-30 | Disposition: A | Discharge status: Home / Self Care | Payer: BLUE CROSS/BLUE SHIELD | Source: Ambulatory Visit | Attending: Nurse Practitioner | Admitting: Nurse Practitioner

## 2019-11-30 ENCOUNTER — Other Ambulatory Visit: Payer: Self-pay

## 2019-11-30 DIAGNOSIS — Z3040 Encounter for surveillance of contraceptives, unspecified: Secondary | ICD-10-CM

## 2019-12-15 ENCOUNTER — Ambulatory Visit: Payer: BC Managed Care – PPO | Admitting: Internal Medicine

## 2019-12-16 NOTE — Progress Notes (Signed)
Name: Rachael Murphy  MRN/ DOB: [id number], 1980/10/30    Age/ Sex: 39 y.o., female     PCP: Donald Prose, MD   Reason for Endocrinology Evaluation: Pituitary macroadenoma      Initial Endocrinology Clinic Visit: 09/10/2019    PATIENT IDENTIFIER: Rachael Murphy is a 39 y.o., female with unremarkable past medical history . She has followed with [facility] since 09/10/2019  for consultative assistance with management of her pituitary adenoma.   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   She is on Cabergoline 0.5 mg twice a week ( Wednesday and Saturday)  Denies nausea and vomiting  No galactorrhea  Has occasional  headaches  no vision changes   Had an eye exam 5/11th, 2021   ROS:  As per HPI.   HISTORY:  Past Medical History:  Past Medical History:  Diagnosis Date   Asthma    Headache    Hypothyroidism    Neuromuscular disorder ([place])    scoliosis   Past Surgical History:  Past Surgical History:  Procedure Laterality Date   NO PAST SURGERIES      Social History:  reports that she has never smoked. She uses smokeless tobacco. She reports that she does not drink alcohol and does not use drugs. Family History:  Family History  Problem Relation Age of Onset   Thyroid disease Mother    Alcohol abuse Neg Hx    Arthritis Neg Hx    Asthma Neg Hx    Birth defects Neg Hx    Cancer Neg Hx    COPD Neg Hx    Depression Neg Hx    Diabetes Neg Hx    Drug  abuse Neg Hx    Early death Neg Hx    Hearing loss Neg Hx    Heart disease Neg Hx    Hyperlipidemia Neg Hx    Hypertension Neg Hx    Kidney disease Neg Hx    Learning disabilities Neg Hx    Mental illness Neg Hx    Mental retardation Neg Hx    Miscarriages / Stillbirths Neg Hx    Stroke Neg Hx    Vision loss Neg Hx    Varicose Veins Neg Hx      HOME MEDICATIONS: Allergies as of 12/18/2019   No Known Allergies     Medication List       Accurate as of December 18, 2019 12:43 PM. If you have any questions, ask your nurse or doctor.        albuterol 108 (90 Base) MCG/ACT inhaler Commonly known as: VENTOLIN HFA Inhale 1-2 puffs into the lungs every 6 (six) hours as needed for wheezing or shortness of breath.   cabergoline 0.5 MG tablet Commonly known as: DOSTINEX Take 1 tablet (0.5 mg total) by mouth 2 (two) times a week.   ELDERBERRY PO Take by mouth.   pantoprazole 40 MG tablet Commonly known as: PROTONIX Take 40 mg by mouth daily.   prenatal  multivitamin Tabs tablet Take 1 tablet by mouth daily at 12 noon.   VITA-C PO Take by mouth.   VITAMIN E COMPLETE PO Take by mouth.         OBJECTIVE:   PHYSICAL EXAM: VS: BP 110/68 (BP Location: Left Arm, Patient Position: Sitting, Cuff Size: Large)    Pulse 89    Ht 5\' 4"  (1.626 m)    Wt 200 lb (90.7 kg)    SpO2 98%    BMI 34.33 kg/m    EXAM: General: Pt appears well and is in NAD  Lungs: Clear with good BS bilat with no rales, rhonchi, or wheezes  Heart: Auscultation: RRR.  Abdomen: Normoactive bowel sounds, soft, nontender, without masses or organomegaly palpable  Extremities:  BL LE: No pretibial edema normal ROM and strength.  Mental Status: Judgment, insight: Intact Orientation: Oriented to time, place, and person Memory: Intact for recent and remote events Mood and affect: No depression, anxiety, or agitation     DATA REVIEWED: Results for Rachael, Murphy (MRN [id number]) as of  12/21/2019 08:28  Ref. Range 09/09/2019 10:12 12/18/2019 10:41  Prolactin Latest Units: ng/mL 73.7 (H) 1.3 (L)    MRI 07/24/2019    Brain: Along the base of the pituitary gland is a relatively hypoenhancing mass which measures 9 by 5 by 11 mm. No evident extension into the cavernous sinus region and the suprasellar cistern and optic chiasm have a normal appearance.  The brain itself shows no infarct, hemorrhage, hydrocephalus, or mass. Brain volume is normal and there is no white matter disease.  Vascular: Normal  Skull and upper cervical spine: Normal marrow signal  Sinuses/Orbits: Small retention cysts in the right maxillary sinus.  IMPRESSION: 11 x 9 x 5 mm pituitary mass/adenoma. No extra-sellar extension  ASSESSMENT / PLAN / RECOMMENDATIONS:   1. Pituitary Macroadenoma:   - 1.1 cm on MRI from 06/2019 - No local symptoms of pituitary macroadenoma - Pt had a recent visual field testing - records not available - TFT, cortisol levels are normal. IGF-1  - normal  - Will repeat pituitary MRI   2.  Prolactinoma  - Tolerating cabergoline  - Repeat prolactin is low, will reduce cabergoline dose as below     Medications : Cabergoline 0.5 mg, Half a tablet  twice weekly   Addendum: Discussed lab results with the on 12/21/2019    Signed electronically by: Mack Guise, MD  [facility] Endocrinology  [facility] [address] [place], [place] [address] Phone: [phone] FAX: [phone]      CC: Donald Prose, [place] [address] Phone: [phone]  Fax: [phone]   Return to Endocrinology clinic as below: Future Appointments  Date Time Provider [place]  04/21/2020  8:30 AM Lorrinda Ramstad, Melanie Crazier, MD [facility] None

## 2019-12-18 ENCOUNTER — Ambulatory Visit: Payer: BLUE CROSS/BLUE SHIELD | Admitting: Internal Medicine

## 2019-12-18 ENCOUNTER — Other Ambulatory Visit: Payer: Self-pay

## 2019-12-18 VITALS — BP 110/68 | HR 89 | Ht 64.0 in | Wt 200.0 lb

## 2019-12-18 DIAGNOSIS — D352 Benign neoplasm of pituitary gland: Secondary | ICD-10-CM | POA: Diagnosis not present

## 2019-12-19 LAB — PROLACTIN: Prolactin: 1.3 ng/mL — ABNORMAL LOW

## 2019-12-21 MED ORDER — CABERGOLINE 0.5 MG PO TABS: 0.2500 mg | ORAL_TABLET | ORAL | 1 refills | Status: DC

## 2020-01-20 ENCOUNTER — Other Ambulatory Visit: Payer: Self-pay | Admitting: Internal Medicine

## 2020-01-23 ENCOUNTER — Ambulatory Visit
Admission: RE | Admit: 2020-01-23 | Discharge: 2020-01-23 | Disposition: A | Discharge status: Home / Self Care | Payer: BLUE CROSS/BLUE SHIELD | Source: Ambulatory Visit | Attending: Internal Medicine | Admitting: Internal Medicine

## 2020-01-23 DIAGNOSIS — D352 Benign neoplasm of pituitary gland: Secondary | ICD-10-CM

## 2020-01-23 MED ORDER — GADOBENATE DIMEGLUMINE 529 MG/ML IV SOLN
10.0000 mL | Freq: Once | INTRAVENOUS | Status: AC | PRN
  Administered 2020-01-23: 10 mL via INTRAVENOUS

## 2020-01-25 ENCOUNTER — Encounter: Payer: Self-pay | Admitting: Internal Medicine

## 2020-04-20 NOTE — Progress Notes (Signed)
Name: Rachael Murphy  MRN/ DOB: [id number], 10-27-1980    Age/ Sex: 39 y.o., female     PCP: Donald Prose, MD   Reason for Endocrinology Evaluation: Pituitary macroadenoma      Initial Endocrinology Clinic Visit: 09/10/2019    PATIENT IDENTIFIER: Rachael Murphy is a 39 y.o., female with unremarkable past medical history . She has followed with [facility] since 09/10/2019  for consultative assistance with management of her pituitary adenoma.   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   She is on Cabergoline 0.5 mg , half a tablet twice a week ( Wednesday and Saturday)  Denies nausea and vomiting  No galactorrhea  Denies headaches unless around menstrual cycle  no vision changes   Had an eye exam 5/11th, 2021   HISTORY:  Past Medical History:  Past Medical History:  Diagnosis Date   Asthma    Headache    Hypothyroidism    Neuromuscular disorder ([place])    scoliosis   Past Surgical History:  Past Surgical History:  Procedure Laterality Date   NO PAST SURGERIES      Social History:  reports that she has never smoked. She uses smokeless tobacco. She reports that she does not drink alcohol and does not use drugs. Family History:  Family History  Problem Relation Age of Onset   Thyroid disease Mother    Alcohol abuse Neg Hx    Arthritis Neg Hx    Asthma Neg Hx    Birth defects Neg Hx    Cancer Neg Hx    COPD Neg Hx    Depression Neg Hx    Diabetes Neg  Hx    Drug abuse Neg Hx    Early death Neg Hx    Hearing loss Neg Hx    Heart disease Neg Hx    Hyperlipidemia Neg Hx    Hypertension Neg Hx    Kidney disease Neg Hx    Learning disabilities Neg Hx    Mental illness Neg Hx    Mental retardation Neg Hx    Miscarriages / Stillbirths Neg Hx    Stroke Neg Hx    Vision loss Neg Hx    Varicose Veins Neg Hx      HOME MEDICATIONS: Allergies as of 04/21/2020      Reactions   Latex Hives      Medication List       Accurate as of April 21, 2020  8:37 AM. If you have any questions, ask your nurse or doctor.        albuterol 108 (90 Base) MCG/ACT inhaler Commonly known as: VENTOLIN HFA Inhale 1-2 puffs into the lungs every 6 (six) hours as needed for wheezing or shortness of breath.   cabergoline 0.5 MG tablet Commonly known as: DOSTINEX Take 0.5 tablets (0.25 mg total) by mouth 2 (two) times a week.   ELDERBERRY PO Take by mouth.   pantoprazole 40 MG tablet Commonly known as: PROTONIX Take 40  mg by mouth daily.   prenatal multivitamin Tabs tablet Take 1 tablet by mouth daily at 12 noon.   VITA-C PO Take by mouth.   VITAMIN E COMPLETE PO Take by mouth.         OBJECTIVE:   PHYSICAL EXAM: VS: BP 124/82    Pulse 82    Temp (!) 97.2 F (36.2 C) (Temporal)    Ht 5\' 4"  (1.626 m)    Wt 194 lb (88 kg)    SpO2 98%    BMI 33.30 kg/m    EXAM: General: Pt appears well and is in NAD  Lungs: Clear with good BS bilat with no rales, rhonchi, or wheezes  Heart: Auscultation: RRR.  Abdomen: Normoactive bowel sounds, soft, nontender, without masses or organomegaly palpable  Extremities:  BL LE: No pretibial edema normal ROM and strength.  Mental Status: Judgment, insight: Intact Orientation: Oriented to time, place, and person Memory: Intact for recent and remote events Mood and affect: No depression, anxiety, or agitation     DATA REVIEWED: Results for Rachael Murphy, Rachael Murphy (MRN [id number]) as of  12/21/2019 08:28  Ref. Range 09/09/2019 10:12 12/18/2019 10:41  Prolactin Latest Units: ng/mL 73.7 (H) 1.3 (L)    MRI 07/24/2019    Brain: Along the base of the pituitary gland is a relatively hypoenhancing mass which measures 9 by 5 by 11 mm. No evident extension into the cavernous sinus region and the suprasellar cistern and optic chiasm have a normal appearance.  The brain itself shows no infarct, hemorrhage, hydrocephalus, or mass. Brain volume is normal and there is no white matter disease.  Vascular: Normal  Skull and upper cervical spine: Normal marrow signal  Sinuses/Orbits: Small retention cysts in the right maxillary sinus.  IMPRESSION: 11 x 9 x 5 mm pituitary mass/adenoma. No extra-sellar extension  ASSESSMENT / PLAN / RECOMMENDATIONS:   1. Pituitary Macroadenoma:   - 1.1 cm on MRI from 06/2019, with decrease size in 12/2019 - No local symptoms of pituitary macroadenoma - Pt had a visual field testing 10/2019 - TFT, cortisol, and IGF-1  levels are normal - Will repeat pituitary MRI 12/2020   2.  Prolactinoma  - Tolerating cabergoline  - Repeat prolactin is normal - No changes     Medications : Cabergoline 0.5 mg, Half a tablet  twice weekly   Addendum: left a message to make no changes on 04/22/2020 at 1500   Signed electronically by: Mack Guise, MD  [facility] Endocrinology  [facility] [address] [place] [address], [place] [address] Phone: [phone] FAX: [phone]      CC: Donald Prose, [place] [address] Phone: [phone]  Fax: [phone]   Return to Endocrinology clinic as below: No future appointments.

## 2020-04-21 ENCOUNTER — Encounter: Payer: Self-pay | Admitting: Internal Medicine

## 2020-04-21 ENCOUNTER — Other Ambulatory Visit: Payer: Self-pay | Admitting: Family Medicine

## 2020-04-21 ENCOUNTER — Ambulatory Visit: Payer: BLUE CROSS/BLUE SHIELD | Admitting: Internal Medicine

## 2020-04-21 ENCOUNTER — Other Ambulatory Visit: Payer: Self-pay

## 2020-04-21 VITALS — BP 124/82 | HR 82 | Temp 97.2°F | Ht 64.0 in | Wt 194.0 lb

## 2020-04-21 DIAGNOSIS — Z0000 Encounter for general adult medical examination without abnormal findings: Secondary | ICD-10-CM

## 2020-04-21 DIAGNOSIS — D352 Benign neoplasm of pituitary gland: Secondary | ICD-10-CM | POA: Diagnosis not present

## 2020-04-21 LAB — BASIC METABOLIC PANEL
BUN: 9 mg/dL (ref 6–23)
CO2: 27 mEq/L (ref 19–32)
Calcium: 9.2 mg/dL (ref 8.4–10.5)
Chloride: 107 mEq/L (ref 96–112)
Creatinine, Ser: 0.75 mg/dL (ref 0.40–1.20)
GFR: 99.87 mL/min (ref >60.00)
Glucose, Bld: 96 mg/dL (ref 70–99)
Potassium: 4.4 mEq/L (ref 3.5–5.1)
Sodium: 140 mEq/L (ref 135–145)

## 2020-04-21 NOTE — Patient Instructions (Signed)
-   Please continue Cabergoline Half a tablet twice a week for now  

## 2020-04-22 LAB — PROLACTIN: Prolactin: 6.3 ng/mL

## 2020-05-30 ENCOUNTER — Ambulatory Visit
Admission: RE | Admit: 2020-05-30 | Discharge: 2020-05-30 | Disposition: A | Discharge status: Home / Self Care | Payer: BLUE CROSS/BLUE SHIELD | Source: Ambulatory Visit | Attending: Family Medicine | Admitting: Family Medicine

## 2020-05-30 ENCOUNTER — Other Ambulatory Visit: Payer: Self-pay

## 2020-05-30 DIAGNOSIS — Z0000 Encounter for general adult medical examination without abnormal findings: Secondary | ICD-10-CM

## 2020-06-23 ENCOUNTER — Other Ambulatory Visit: Payer: Self-pay | Admitting: Internal Medicine

## 2020-08-24 NOTE — Progress Notes (Signed)
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 She has followed with [facility] since 09/10/2019  for consultative assistance with management of her pituitary adenoma.     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   She is on Cabergoline 0.5 mg , half a tablet twice a week ( Wednesday and Saturday)  Denies nausea and vomiting  No galactorrhea  Denies headaches unless around menstrual cycle  No vision changes   Had an eye exam 5/11th, 2021 She has an IUD     HISTORY:  Past Medical History:  Past Medical History:  Diagnosis Date  . Asthma   . Headache   . Hypothyroidism   . Neuromuscular disorder ([place])    scoliosis   Past Surgical History:  Past Surgical History:  Procedure Laterality Date  . NO PAST SURGERIES      Social History:  reports that she has never smoked. She uses smokeless tobacco. She reports that she does not drink alcohol and does not use drugs. Family History:  Family History  Problem Relation Age of Onset  . Thyroid disease Mother   . Alcohol abuse Neg Hx   . Arthritis Neg Hx   . Asthma Neg Hx   . Birth defects Neg Hx   . Cancer Neg Hx   . COPD Neg Hx   . Depression Neg  Hx   . Diabetes Neg Hx   . Drug abuse Neg Hx   . Early death Neg Hx   . Hearing loss Neg Hx   . Heart disease Neg Hx   . Hyperlipidemia Neg Hx   . Hypertension Neg Hx   . Kidney disease Neg Hx   . Learning disabilities Neg Hx   . Mental illness Neg Hx   . Mental retardation Neg Hx   . Miscarriages / Stillbirths Neg Hx   . Stroke Neg Hx   . Vision loss Neg Hx   . Varicose Veins Neg Hx      HOME MEDICATIONS: Allergies as of 08/25/2020      Reactions   Latex Hives      Medication List       Accurate as of August 25, 2020  8:39 AM. If you have any questions, ask your nurse or doctor.        albuterol 108 (90 Base) MCG/ACT inhaler Commonly known as: VENTOLIN HFA Inhale 1-2 puffs into the lungs every 6 (six) hours as needed for wheezing or shortness of breath.   cabergoline 0.5 MG tablet Commonly known as: DOSTINEX TAKE 1/2 TABLET BY MOUTH 2 TIMES A WEEK   ELDERBERRY PO Take by mouth.   pantoprazole 40 MG tablet Commonly known  as: PROTONIX Take 40 mg by mouth daily.   prenatal multivitamin Tabs tablet Take 1 tablet by mouth daily at 12 noon.   VITA-C PO Take by mouth.   VITAMIN E COMPLETE PO Take by mouth.         OBJECTIVE:   PHYSICAL EXAM: VS: BP 118/72   Pulse 87   Ht 5\' 4"  (1.626 m)   Wt 189 lb 8 oz (86 kg)   LMP 08/08/2020   SpO2 97%   BMI 32.53 kg/m    EXAM: General: Pt appears well and is in NAD  Lungs: Clear with good BS bilat with no rales, rhonchi, or wheezes  Heart: Auscultation: RRR.  Abdomen: Normoactive bowel sounds, soft, nontender, without masses or organomegaly palpable  Extremities:  BL LE: No pretibial edema normal ROM and strength.  Mental Status: Judgment, insight: Intact Orientation: Oriented to time, place, and person Memory: Intact for recent and remote events Mood and affect: No depression, anxiety, or agitation     DATA REVIEWED: Results for TAQUILLA, DOWNUM (MRN [id number]) as of 08/26/2020 13:49  Ref. Range  04/21/2020 08:41 08/25/2020 08:47  Prolactin Latest Units: ng/mL 6.3 4.1    Results for CHRISTIEN, BERTHELOT (MRN [id number]) as of 08/26/2020 13:49  Ref. Range 08/25/2020 08:47  Sodium Latest Ref Range: 135 - 145 mEq/L 138  Potassium Latest Ref Range: 3.5 - 5.1 mEq/L 4.3  Chloride Latest Ref Range: 96 - 112 mEq/L 105  CO2 Latest Ref Range: 19 - 32 mEq/L 30  Glucose Latest Ref Range: 70 - 99 mg/dL 97  BUN Latest Ref Range: 6 - 23 mg/dL 13  Creatinine Latest Ref Range: 0.40 - 1.20 mg/dL 0.75  Calcium Latest Ref Range: 8.4 - 10.5 mg/dL 9.6  GFR Latest Ref Range: >60.00 mL/min 99.63     MRI 01/23/2020    FINDINGS: Brain:  As compared to the prior MRI of 07/24/2019, there has been an interval increase in size of a hypoenhancing mass within the base of the pituitary gland. As before, this is consistent with a pituitary macroadenoma. The mass now measures 11 x 7 x 4 mm (previously 11 x 9 x 5 mm) (series 21, image 5) (series 20, image 7). There is no evidence of cavernous sinus extension. No suprasellar extension or mass effect upon the optic chiasm.  There is no focal parenchymal signal abnormality or abnormal intracranial enhancement elsewhere within the brain.  There is no acute infarct.  No evidence of intracranial mass.  No chronic intracranial blood products.  No extra-axial fluid collection.  No midline shift.  Vascular: Expected proximal arterial flow voids.  Skull and upper cervical spine: No focal marrow lesion.  Sinuses/Orbits: Visualized orbits show no acute finding. Mild ethmoid and maxillary sinus mucosal thickening. Small bilateral maxillary sinus mucous retention cysts. No significant mastoid effusion  IMPRESSION: Interval decrease in size of a pituitary mass/macroadenoma as compared to the MRI of 07/24/2019. The mass now measures 11 x 7 x 4 mm. No evidence of extra-sellar extension.  Mild ethmoid and maxillary sinus mucosal thickening. Small  bilateral maxillary sinus mucous retention cysts.  ASSESSMENT / PLAN / RECOMMENDATIONS:   1. Pituitary Macroadenoma:   - 1.1 cm on MRI from 06/2019, with decrease size in 12/2019 - No local symptoms of pituitary macroadenoma - Pt had a visual field testing 10/2019 - TFT, cortisol, and IGF-1  levels are normal - Will repeat pituitary MRI by 12/2020 or by next visit    2.  Prolactinoma  -  Tolerating cabergoline  - Repeat prolactin is normal and trending down , will reduce cabergoline as below  - No changes     Medications : Cabergoline 0.5 mg, Half a tablet  Once weekly    F/U in 6 months    Addendum: Discussed results and recommendations with the pt on 08/26/2020 Signed electronically by: Mack Guise, MD  [facility]  [facility] [address] [place], [place] [address] Phone: [phone] FAX: [phone]      CC: Donald Prose, [place] [address] Phone: [phone]  Fax: [phone]   Return to Endocrinology clinic as below: No future appointments.

## 2020-08-25 ENCOUNTER — Ambulatory Visit: Payer: BLUE CROSS/BLUE SHIELD | Admitting: Internal Medicine

## 2020-08-25 ENCOUNTER — Encounter: Payer: Self-pay | Admitting: Internal Medicine

## 2020-08-25 ENCOUNTER — Other Ambulatory Visit: Payer: Self-pay

## 2020-08-25 VITALS — BP 118/72 | HR 87 | Ht 64.0 in | Wt 189.5 lb

## 2020-08-25 DIAGNOSIS — D352 Benign neoplasm of pituitary gland: Secondary | ICD-10-CM | POA: Diagnosis not present

## 2020-08-25 LAB — BASIC METABOLIC PANEL
BUN: 13 mg/dL (ref 6–23)
CO2: 30 mEq/L (ref 19–32)
Calcium: 9.6 mg/dL (ref 8.4–10.5)
Chloride: 105 mEq/L (ref 96–112)
Creatinine, Ser: 0.75 mg/dL (ref 0.40–1.20)
GFR: 99.63 mL/min (ref >60.00)
Glucose, Bld: 97 mg/dL (ref 70–99)
Potassium: 4.3 mEq/L (ref 3.5–5.1)
Sodium: 138 mEq/L (ref 135–145)

## 2020-08-25 NOTE — Patient Instructions (Signed)
-   Please continue Cabergoline Half a tablet twice a week for now

## 2020-08-26 LAB — PROLACTIN: Prolactin: 4.1 ng/mL

## 2020-08-26 MED ORDER — CABERGOLINE 0.5 MG PO TABS: 0.2500 mg | ORAL_TABLET | ORAL | 3 refills | Status: DC

## 2021-03-01 ENCOUNTER — Other Ambulatory Visit: Payer: Self-pay

## 2021-03-01 ENCOUNTER — Ambulatory Visit: Payer: BC Managed Care – PPO | Admitting: Internal Medicine

## 2021-03-01 ENCOUNTER — Encounter: Payer: Self-pay | Admitting: Internal Medicine

## 2021-03-01 VITALS — BP 130/74 | HR 76 | Ht 64.0 in | Wt 197.0 lb

## 2021-03-01 DIAGNOSIS — D352 Benign neoplasm of pituitary gland: Secondary | ICD-10-CM

## 2021-03-01 LAB — COMPREHENSIVE METABOLIC PANEL
ALT: 16 U/L (ref 0–35)
AST: 14 U/L (ref 0–37)
Albumin: 4.2 g/dL (ref 3.5–5.2)
Alkaline Phosphatase: 50 U/L (ref 39–117)
BUN: 9 mg/dL (ref 6–23)
CO2: 28 mEq/L (ref 19–32)
Calcium: 9.2 mg/dL (ref 8.4–10.5)
Chloride: 107 mEq/L (ref 96–112)
Creatinine, Ser: 0.73 mg/dL (ref 0.40–1.20)
GFR: 102.54 mL/min (ref >60.00)
Glucose, Bld: 104 mg/dL — ABNORMAL HIGH (ref 70–99)
Potassium: 4.1 mEq/L (ref 3.5–5.1)
Sodium: 141 mEq/L (ref 135–145)
Total Bilirubin: 0.6 mg/dL (ref 0.2–1.2)
Total Protein: 7 g/dL (ref 6.0–8.3)

## 2021-03-01 LAB — TSH: TSH: 4.9 u[IU]/mL (ref 0.35–5.50)

## 2021-03-01 LAB — T4, FREE: Free T4: 0.8 ng/dL (ref 0.60–1.60)

## 2021-03-01 LAB — CORTISOL: Cortisol, Plasma: 8.2 ug/dL

## 2021-03-01 NOTE — Progress Notes (Signed)
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 She has followed with [facility] since 09/10/2019  for consultative assistance with management of her pituitary adenoma.     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   She is on Cabergoline 0.5 mg , half a tablet weekly   Denies nausea and vomiting  No galactorrhea  Continues with headaches around menstruation cycle No visual changes   Denies weight changes  Denies constipation or diarrhea  Denies palpitations    Had an eye exam 10/2020, no VF  She has an IUD     HISTORY:  Past Medical History:  Past Medical History:  Diagnosis Date   Asthma    Headache    Hypothyroidism    Neuromuscular disorder ([place])    scoliosis   Past Surgical History:  Past Surgical History:  Procedure Laterality Date   NO PAST SURGERIES     Social History:  reports that she has never smoked. She uses smokeless tobacco. She reports that she does not drink alcohol and does not use drugs. Family History:  Family History  Problem Relation Age of Onset   Thyroid disease Mother    Alcohol abuse Neg Hx    Arthritis Neg Hx    Asthma Neg Hx    Birth defects Neg Hx     Cancer Neg Hx    COPD Neg Hx    Depression Neg Hx    Diabetes Neg Hx    Drug abuse Neg Hx    Early death Neg Hx    Hearing loss Neg Hx    Heart disease Neg Hx    Hyperlipidemia Neg Hx    Hypertension Neg Hx    Kidney disease Neg Hx    Learning disabilities Neg Hx    Mental illness Neg Hx    Mental retardation Neg Hx    Miscarriages / Stillbirths Neg Hx    Stroke Neg Hx    Vision loss Neg Hx    Varicose Veins Neg Hx      HOME MEDICATIONS: Allergies as of 03/01/2021       Reactions   Latex Hives        Medication List        Accurate as of March 01, 2021  8:45 AM. If you have any questions, ask your nurse or doctor.          albuterol 108 (90 Base) MCG/ACT inhaler Commonly known as: VENTOLIN HFA Inhale 1-2 puffs into the lungs every 6 (six) hours as needed for wheezing or shortness of breath.   cabergoline 0.5 MG tablet Commonly known as: DOSTINEX Take 0.5 tablets (0.25 mg total) by mouth once  a week.   ELDERBERRY PO Take by mouth.   pantoprazole 40 MG tablet Commonly known as: PROTONIX Take 40 mg by mouth daily.   prenatal multivitamin Tabs tablet Take 1 tablet by mouth daily at 12 noon.   VITA-C PO Take by mouth.   VITAMIN E COMPLETE PO Take by mouth.          OBJECTIVE:   PHYSICAL EXAM: VS: BP 130/74 (BP Location: Left Arm, Patient Position: Sitting, Cuff Size: Large)   Pulse 76   Ht '5\' 4"'$  (1.626 m)   Wt 197 lb (89.4 kg)   SpO2 98%   BMI 33.81 kg/m    EXAM: General: Pt appears well and is in NAD  Lungs: Clear with good BS bilat with no rales, rhonchi, or wheezes  Heart: Auscultation: RRR.  Abdomen: Normoactive bowel sounds, soft, nontender, without masses or organomegaly palpable  Extremities:  BL LE: No pretibial edema normal ROM and strength.  Mental Status: Judgment, insight: Intact Orientation: Oriented to time, place, and person Memory: Intact for recent and remote events Mood and affect: No depression, anxiety, or  agitation     DATA REVIEWED: Results for AVALIA, SLONECKER (MRN [id number]) as of 03/03/2021 08:38  Ref. Range 03/01/2021 08:48  Sodium Latest Ref Range: 135 - 145 mEq/L 141  Potassium Latest Ref Range: 3.5 - 5.1 mEq/L 4.1  Chloride Latest Ref Range: 96 - 112 mEq/L 107  CO2 Latest Ref Range: 19 - 32 mEq/L 28  Glucose Latest Ref Range: 70 - 99 mg/dL 104 (H)  BUN Latest Ref Range: 6 - 23 mg/dL 9  Creatinine Latest Ref Range: 0.40 - 1.20 mg/dL 0.73  Calcium Latest Ref Range: 8.4 - 10.5 mg/dL 9.2  Alkaline Phosphatase Latest Ref Range: 39 - 117 U/L 50  Albumin Latest Ref Range: 3.5 - 5.2 g/dL 4.2  AST Latest Ref Range: 0 - 37 U/L 14  ALT Latest Ref Range: 0 - 35 U/L 16  Total Protein Latest Ref Range: 6.0 - 8.3 g/dL 7.0  Total Bilirubin Latest Ref Range: 0.2 - 1.2 mg/dL 0.6  GFR Latest Ref Range: >60.00 mL/min 102.54  Cortisol, Plasma Latest Units: ug/dL 8.2  Prolactin Latest Units: ng/mL 4.7  TSH Latest Ref Range: 0.35 - 5.50 uIU/mL 4.90  T4,Free(Direct) Latest Ref Range: 0.60 - 1.60 ng/dL 0.80    MRI 01/23/2020    FINDINGS: Brain:   As compared to the prior MRI of 07/24/2019, there has been an interval increase in size of a hypoenhancing mass within the base of the pituitary gland. As before, this is consistent with a pituitary macroadenoma. The mass now measures 11 x 7 x 4 mm (previously 11 x 9 x 5 mm) (series 21, image 5) (series 20, image 7). There is no evidence of cavernous sinus extension. No suprasellar extension or mass effect upon the optic chiasm.   There is no focal parenchymal signal abnormality or abnormal intracranial enhancement elsewhere within the brain.   There is no acute infarct.   No evidence of intracranial mass.   No chronic intracranial blood products.   No extra-axial fluid collection.   No midline shift.   Vascular: Expected proximal arterial flow voids.   Skull and upper cervical spine: No focal marrow lesion.   Sinuses/Orbits: Visualized  orbits show no acute finding. Mild ethmoid and maxillary sinus mucosal thickening. Small bilateral maxillary sinus mucous retention cysts. No significant mastoid effusion   IMPRESSION: Interval decrease in size of a pituitary mass/macroadenoma as compared to  the MRI of 07/24/2019. The mass now measures 11 x 7 x 4 mm. No evidence of extra-sellar extension.   Mild ethmoid and maxillary sinus mucosal thickening. Small bilateral maxillary sinus mucous retention cysts.  ASSESSMENT / PLAN / RECOMMENDATIONS:   Pituitary Macroadenoma:    - 1.1 cm on MRI from 06/2019, with decrease size in 12/2019 - No local symptoms of pituitary macroadenoma - Pt had a visual field testing 10/2020 with NO defect - TFT, cortisol, and IGF-1  levels are normal 2021  - Pending ACTH, cortisol,  - Will repeat pituitary MRI     2.  Prolactinoma   - Tolerating cabergoline  - Repeat prolactin is normal  - No changes     Medications : Cabergoline 0.5 mg, Half a tablet  Once weekly    F/U in 6 months    Addendum: Discussed results and recommendations with the pt on 08/26/2020 Signed electronically by: Mack Guise, MD  [facility]  [facility] [address] [place],  [address] Phone: [phone] FAX: [phone]      CC: Donald Prose, [place] [address] Phone: [phone]  Fax: [phone]   Return to Endocrinology clinic as below: No future appointments.

## 2021-03-03 ENCOUNTER — Telehealth: Payer: Self-pay | Admitting: Internal Medicine

## 2021-03-03 NOTE — Telephone Encounter (Signed)
Called and lvm for patient call us back. 

## 2021-03-03 NOTE — Telephone Encounter (Signed)
Please let the pt know that her prolactin is normal and stable but I would hold off on changing cabergoline at this time until her MRI has been done and then decide.     Her kidney test and thyroid are all normal.     Thanks

## 2021-03-05 LAB — ACTH: C206 ACTH: 38 pg/mL (ref 6–50)

## 2021-03-05 LAB — PROLACTIN: Prolactin: 4.7 ng/mL

## 2021-03-06 ENCOUNTER — Encounter: Payer: Self-pay | Admitting: Internal Medicine

## 2021-03-06 NOTE — Telephone Encounter (Signed)
Patient has been notified

## 2021-03-23 ENCOUNTER — Ambulatory Visit
Admission: RE | Admit: 2021-03-23 | Discharge: 2021-03-23 | Disposition: A | Discharge status: Home / Self Care | Payer: BC Managed Care – PPO | Source: Ambulatory Visit | Attending: Internal Medicine | Admitting: Internal Medicine

## 2021-03-23 ENCOUNTER — Other Ambulatory Visit: Payer: Self-pay

## 2021-03-23 DIAGNOSIS — D352 Benign neoplasm of pituitary gland: Secondary | ICD-10-CM

## 2021-03-23 MED ORDER — GADOBENATE DIMEGLUMINE 529 MG/ML IV SOLN
19.0000 mL | Freq: Once | INTRAVENOUS | Status: AC | PRN
  Administered 2021-03-23: 19 mL via INTRAVENOUS

## 2021-03-27 ENCOUNTER — Telehealth: Payer: Self-pay | Admitting: Internal Medicine

## 2021-03-27 NOTE — Telephone Encounter (Signed)
Spoke to the patient on 03/27/2021 at 10:50 AM   Discussed MRI results with stable pituitary adenoma at 10 x 8 x 4 mm   We have opted to continue with cabergoline half a tablet once a week   I explained to the patient to stop cabergoline if she test positive for pregnancy     Patient expressed understanding Abby Nena Jordan, MD  [facility]  [facility] [address] [place], [place] [address] Phone: [phone] FAX: [phone]

## 2021-05-09 ENCOUNTER — Other Ambulatory Visit: Payer: Self-pay | Admitting: Internal Medicine

## 2021-05-29 ENCOUNTER — Other Ambulatory Visit: Payer: Self-pay | Admitting: Family Medicine

## 2021-05-29 DIAGNOSIS — Z1231 Encounter for screening mammogram for malignant neoplasm of breast: Secondary | ICD-10-CM

## 2021-07-04 ENCOUNTER — Ambulatory Visit
Admission: RE | Admit: 2021-07-04 | Discharge: 2021-07-04 | Disposition: A | Discharge status: Home / Self Care | Payer: BC Managed Care – PPO | Source: Ambulatory Visit | Attending: Family Medicine | Admitting: Family Medicine

## 2021-07-04 DIAGNOSIS — Z1231 Encounter for screening mammogram for malignant neoplasm of breast: Secondary | ICD-10-CM

## 2021-08-30 ENCOUNTER — Other Ambulatory Visit: Payer: Self-pay

## 2021-08-30 ENCOUNTER — Encounter: Payer: Self-pay | Admitting: Internal Medicine

## 2021-08-30 ENCOUNTER — Ambulatory Visit: Payer: BC Managed Care – PPO | Admitting: Internal Medicine

## 2021-08-30 VITALS — BP 122/68 | HR 62 | Ht 64.0 in | Wt 197.0 lb

## 2021-08-30 DIAGNOSIS — D352 Benign neoplasm of pituitary gland: Secondary | ICD-10-CM

## 2021-08-30 LAB — T4, FREE: Free T4: 0.97 ng/dL (ref 0.60–1.60)

## 2021-08-30 LAB — COMPREHENSIVE METABOLIC PANEL
ALT: 14 U/L (ref 0–35)
AST: 14 U/L (ref 0–37)
Albumin: 4.4 g/dL (ref 3.5–5.2)
Alkaline Phosphatase: 53 U/L (ref 39–117)
BUN: 8 mg/dL (ref 6–23)
CO2: 28 mEq/L (ref 19–32)
Calcium: 9.3 mg/dL (ref 8.4–10.5)
Chloride: 103 mEq/L (ref 96–112)
Creatinine, Ser: 0.75 mg/dL (ref 0.40–1.20)
GFR: 98.92 mL/min (ref >60.00)
Glucose, Bld: 103 mg/dL — ABNORMAL HIGH (ref 70–99)
Potassium: 4 mEq/L (ref 3.5–5.1)
Sodium: 139 mEq/L (ref 135–145)
Total Bilirubin: 0.7 mg/dL (ref 0.2–1.2)
Total Protein: 6.9 g/dL (ref 6.0–8.3)

## 2021-08-30 LAB — TSH: TSH: 3.24 u[IU]/mL (ref 0.35–5.50)

## 2021-08-30 NOTE — Patient Instructions (Signed)
Continue Cabergoline 0.5 mg, Half a tablet once weekly  ?

## 2021-08-30 NOTE — Progress Notes (Signed)
? ?Name: Rachael Murphy  ?MRN/ DOB: [id number], 1981-02-01    ?Age/ Sex: 41 y.o., female   ? ? 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   ?Initial Endocrinology Clinic Visit: 09/10/2019  ? ? ?PATIENT IDENTIFIER: Rachael Murphy is a 41 y.o., female with unremarkable past medical history . She has followed with [facility] since 09/10/2019  for consultative assistance with management of her pituitary adenoma.  ? ? ? 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 ?She had noted irregular periods in 2016  ?Galactorrhea was noted in 2019 ? ? ?Cabergoline started 08/2019  ? ? ?Rest of pituitary check up was normal 3/2021to include TFT's, FSH, IGF-1 and cortisol ? ?Maternal GM thyroid disease  ?SUBJECTIVE:  ? ? ? ?Today (08/30/2021):  Rachael Murphy is here for a follow up on pituitary macroadenoma and prolactinoma.  ? ?She is on Cabergoline 0.5 mg , half a tablet weekly  ? ? ?Weight stable  ?Denies nausea and vomiting, constipation or diarrhea   ?No galactorrhea  ?Continues with headaches around menstruation cycle ?No visual changes  ? ? ? ? ?Had an eye exam 10/2020, no VF  ?She has an IUD  ? ? ? ?HISTORY:  ?Past Medical History:  ?Past Medical History:  ?Diagnosis Date  ? Asthma   ? Headache   ? Hypothyroidism   ? Neuromuscular disorder ([place])   ? scoliosis  ? ?Past Surgical History:  ?Past Surgical History:  ?Procedure Laterality Date  ? NO PAST SURGERIES    ? ?Social History:  reports that she has never smoked. She uses smokeless tobacco. She reports that she does not drink alcohol and does not use drugs. ?Family History:  ?Family History  ?Problem Relation Age of Onset  ? Thyroid disease Mother   ? Breast cancer Paternal Aunt   ? Breast cancer Paternal Aunt   ? Alcohol abuse Neg Hx   ? Arthritis Neg Hx   ? Asthma Neg Hx    ? Birth defects Neg Hx   ? Cancer Neg Hx   ? COPD Neg Hx   ? Depression Neg Hx   ? Diabetes Neg Hx   ? Drug abuse Neg Hx   ? Early death Neg Hx   ? Hearing loss Neg Hx   ? Heart disease Neg Hx   ? Hyperlipidemia Neg Hx   ? Hypertension Neg Hx   ? Kidney disease Neg Hx   ? Learning disabilities Neg Hx   ? Mental illness Neg Hx   ? Mental retardation Neg Hx   ? Miscarriages / Stillbirths Neg Hx   ? Stroke Neg Hx   ? Vision loss Neg Hx   ? Varicose Veins Neg Hx   ? ? ? ?HOME MEDICATIONS: ?Allergies as of 08/30/2021   ? ?   Reactions  ? Latex Hives  ? ?  ? ?  ?Medication List  ?  ? ?  ? Accurate as of August 30, 2021  7:19 AM. If you have any questions, ask your nurse or doctor.  ?  ?  ? ?  ? ?albuterol 108 (90 Base) MCG/ACT inhaler ?Commonly known as: VENTOLIN HFA ?Inhale 1-2 puffs into the lungs every 6 (six) hours as needed for wheezing or shortness of breath. ?  ?cabergoline 0.5 MG tablet ?Commonly known  as: DOSTINEX ?TAKE 0.5 TABLETS (0.25 MG TOTAL) BY MOUTH ONCE A WEEK. ?  ?ELDERBERRY PO ?Take by mouth. ?  ?pantoprazole 40 MG tablet ?Commonly known as: PROTONIX ?Take 40 mg by mouth daily. ?  ?prenatal multivitamin Tabs tablet ?Take 1 tablet by mouth daily at 12 noon. ?  ?VITA-C PO ?Take by mouth. ?  ?VITAMIN E COMPLETE PO ?Take by mouth. ?  ? ?  ? ? ? ? ?OBJECTIVE:  ? ?PHYSICAL EXAM: ?VS: BP 122/68 (BP Location: Left Arm, Patient Position: Sitting, Cuff Size: Small)   Pulse 62   Ht '5\' 4"'$  (1.626 m)   Wt 197 lb (89.4 kg)   SpO2 98%   BMI 33.81 kg/m?  ? ? ?EXAM: ?General: Pt appears well and is in NAD  ?Lungs: Clear with good BS bilat with no rales, rhonchi, or wheezes  ?Heart: Auscultation: RRR.  ?Abdomen: Normoactive bowel sounds, soft, nontender, without masses or organomegaly palpable  ?Extremities:  ?BL LE: No pretibial edema normal ROM and strength.  ?Mental Status: Judgment, insight: Intact ?Orientation: Oriented to time, place, and person ?Mood and affect: No depression, anxiety, or agitation  ? ? ? ?DATA  REVIEWED: ? Latest Reference Range & Units 08/30/21 08:45  ?Sodium 135 - 145 mEq/L 139  ?Potassium 3.5 - 5.1 mEq/L 4.0  ?Chloride 96 - 112 mEq/L 103  ?CO2 19 - 32 mEq/L 28  ?Glucose 70 - 99 mg/dL 103 (H)  ?BUN 6 - 23 mg/dL 8  ?Creatinine 0.40 - 1.20 mg/dL 0.75  ?Calcium 8.4 - 10.5 mg/dL 9.3  ?Alkaline Phosphatase 39 - 117 U/L 53  ?Albumin 3.5 - 5.2 g/dL 4.4  ?AST 0 - 37 U/L 14  ?ALT 0 - 35 U/L 14  ?Total Protein 6.0 - 8.3 g/dL 6.9  ?Total Bilirubin 0.2 - 1.2 mg/dL 0.7  ?GFR >60.00 mL/min 98.92  ? ? Latest Reference Range & Units 08/30/21 08:45  ?Prolactin ng/mL 6.8  ?Glucose 70 - 99 mg/dL 103 (H)  ?TSH 0.35 - 5.50 uIU/mL 3.24  ?T4,Free(Direct) 0.60 - 1.60 ng/dL 0.97  ? ? ? ? ?MRI 03/23/2021 ? ?FINDINGS: ?Brain: No acute infarction, hemorrhage, hydrocephalus, or ?extra-axial collection. ?  ?Pituitary/Sella: A hypoenhancing pituitary lesion is again seen ?measuring approximately 10 x 8 x 4 mm (compared to 11 x 7 x 4 mm on ?prior). A normal posterior pituitary bright spot is seen. The ?infundibulum is midline. The hypothalamus and mamillary bodies are ?normal. There is no mass effect on the optic chiasm or optic nerves. ?The infundibular and chiasmatic recesses are clear. Normal cavernous ?sinus and cavernous internal carotid artery flow voids. ?  ?Vascular: Normal flow voids. ?  ?Skull and upper cervical spine: Normal marrow signal. ?  ?Sinuses/Orbits: Trace mucosal thickening throughout the paranasal ?sinuses. The orbits are maintained. ?  ?Other: None. ?  ?IMPRESSION: ?Stable size of pituitary adenoma measuring approximately 11 x 8 x 4 ?mm. Otherwise, unremarkable MRI of brain. ?  ? ? ?ASSESSMENT / PLAN / RECOMMENDATIONS:  ? ?Pituitary Macroadenoma: ?  ? ?- 1.1 cm on MRI from 06/2019, with decrease size in 12/2019 and stable 02/2021 ?- No local symptoms of pituitary macroadenoma ?- Pt had a visual field testing 10/2020 with NO defect ?- TFT, cortisol, and IGF-1  levels are normal 2021  ?  ?2.  Prolactinoma ?  ?-  Tolerating cabergoline  ?- Repeat prolactin is normal  ?- No changes  ? ? ? ?Medications : ?Cabergoline 0.5 mg, Half a tablet  Once weekly  ? ? ?  F/U in 1 year ? ?Signed electronically by: ?Abby Nena Jordan, MD ? ?[facility]  ?[facility] ?[address] ?[place], [place] [address] ?Phone: [phone] ?FAX: [phone]  ? ? ? ? ?CC: ?Donald Prose, MD ?[address] ?[place] [address] ?Phone: [phone]  ?Fax: [phone] ? ? ?Return to Endocrinology clinic as below: ?Future Appointments  ?Date Time Provider [place]  ?08/30/2021  8:30 AM Tawonna Esquer, Melanie Crazier, MD [id number] None  ? ?  ? ?

## 2021-08-31 ENCOUNTER — Encounter: Payer: Self-pay | Admitting: Internal Medicine

## 2021-08-31 LAB — PROLACTIN: Prolactin: 6.8 ng/mL

## 2021-08-31 MED ORDER — CABERGOLINE 0.5 MG PO TABS: 0.2500 mg | ORAL_TABLET | ORAL | 3 refills | Status: DC

## 2021-09-21 ENCOUNTER — Other Ambulatory Visit: Payer: Self-pay | Admitting: Internal Medicine

## 2022-05-07 ENCOUNTER — Other Ambulatory Visit (HOSPITAL_COMMUNITY)
Admission: RE | Admit: 2022-05-07 | Discharge: 2022-05-07 | Disposition: A | Discharge status: Home / Self Care | Payer: BC Managed Care – PPO | Source: Ambulatory Visit | Attending: Nurse Practitioner | Admitting: Nurse Practitioner

## 2022-05-07 ENCOUNTER — Other Ambulatory Visit: Payer: Self-pay | Admitting: Nurse Practitioner

## 2022-05-07 DIAGNOSIS — Z124 Encounter for screening for malignant neoplasm of cervix: Secondary | ICD-10-CM | POA: Diagnosis present

## 2022-05-09 LAB — CYTOLOGY - PAP
Comment: NEGATIVE
Diagnosis: NEGATIVE
High risk HPV: NEGATIVE

## 2022-06-06 ENCOUNTER — Ambulatory Visit
Admission: RE | Admit: 2022-06-06 | Discharge: 2022-06-06 | Disposition: A | Discharge status: Home / Self Care | Payer: BC Managed Care – PPO | Source: Ambulatory Visit | Attending: Nurse Practitioner | Admitting: Nurse Practitioner

## 2022-06-06 ENCOUNTER — Other Ambulatory Visit: Payer: Self-pay | Admitting: Nurse Practitioner

## 2022-06-06 DIAGNOSIS — Z975 Presence of (intrauterine) contraceptive device: Secondary | ICD-10-CM

## 2022-08-21 ENCOUNTER — Other Ambulatory Visit: Payer: Self-pay | Admitting: Family Medicine

## 2022-08-21 DIAGNOSIS — Z1231 Encounter for screening mammogram for malignant neoplasm of breast: Secondary | ICD-10-CM

## 2022-08-24 ENCOUNTER — Ambulatory Visit
Admission: RE | Admit: 2022-08-24 | Discharge: 2022-08-24 | Disposition: A | Discharge status: Home / Self Care | Payer: BC Managed Care – PPO | Source: Ambulatory Visit | Attending: Family Medicine | Admitting: Family Medicine

## 2022-08-24 DIAGNOSIS — Z1231 Encounter for screening mammogram for malignant neoplasm of breast: Secondary | ICD-10-CM

## 2022-08-29 ENCOUNTER — Ambulatory Visit
Admission: RE | Admit: 2022-08-29 | Discharge: 2022-08-29 | Disposition: A | Discharge status: Home / Self Care | Payer: BC Managed Care – PPO | Source: Ambulatory Visit | Attending: Family Medicine | Admitting: Family Medicine

## 2022-09-03 NOTE — Progress Notes (Unsigned)
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 She has followed with [facility] since 09/10/2019  for consultative assistance with management of her pituitary adenoma.     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   She is on Cabergoline 0.5 mg , half a tablet weekly    Weight stable  Denies nausea and vomiting, constipation or diarrhea   No galactorrhea  Continues with headaches around menstruation cycle No visual changes      Had an eye exam 10/2020, no VF  She has an IUD     HISTORY:  Past Medical History:  Past Medical History:  Diagnosis Date   Asthma    Headache    Hypothyroidism    Neuromuscular disorder ([place])    scoliosis   Past Surgical History:  Past Surgical History:  Procedure Laterality Date   NO PAST SURGERIES     Social History:  reports that she has never smoked. She uses smokeless tobacco. She reports that she does not drink alcohol and does not use drugs. Family History:  Family History  Problem Relation Age of Onset   Thyroid disease Mother    Breast cancer Paternal Aunt    Breast cancer Paternal Aunt    Alcohol abuse Neg Hx    Arthritis Neg Hx    Asthma Neg Hx     Birth defects Neg Hx    Cancer Neg Hx    COPD Neg Hx    Depression Neg Hx    Diabetes Neg Hx    Drug abuse Neg Hx    Early death Neg Hx    Hearing loss Neg Hx    Heart disease Neg Hx    Hyperlipidemia Neg Hx    Hypertension Neg Hx    Kidney disease Neg Hx    Learning disabilities Neg Hx    Mental illness Neg Hx    Mental retardation Neg Hx    Miscarriages / Stillbirths Neg Hx    Stroke Neg Hx    Vision loss Neg Hx    Varicose Veins Neg Hx      HOME MEDICATIONS: Allergies as of 09/04/2022       Reactions   Latex Hives        Medication List        Accurate as of September 03, 2022  1:52 PM. If you have any questions, ask your nurse or doctor.          albuterol 108 (90 Base) MCG/ACT inhaler Commonly known as: VENTOLIN HFA Inhale 1-2 puffs into the lungs every 6 (six) hours as needed for wheezing or shortness of breath.   cabergoline 0.5 MG tablet Commonly known  as: DOSTINEX TAKE 0.5 TABLETS (0.25 MG TOTAL) BY MOUTH ONCE A WEEK.   ELDERBERRY PO Take by mouth.   pantoprazole 40 MG tablet Commonly known as: PROTONIX Take 40 mg by mouth daily.   VITA-C PO Take by mouth.   VITAMIN E COMPLETE PO Take by mouth.          OBJECTIVE:   PHYSICAL EXAM: VS: There were no vitals taken for this visit.   EXAM: General: Pt appears well and is in NAD  Lungs: Clear with good BS bilat with no rales, rhonchi, or wheezes  Heart: Auscultation: RRR.  Abdomen: Normoactive bowel sounds, soft, nontender, without masses or organomegaly palpable  Extremities:  BL LE: No pretibial edema normal ROM and strength.  Mental Status: Judgment, insight: Intact Orientation: Oriented to time, place, and person Mood and affect: No depression, anxiety, or agitation     DATA REVIEWED:  Latest Reference Range & Units 08/30/21 08:45  Sodium 135 - 145 mEq/L 139  Potassium 3.5 - 5.1 mEq/L 4.0  Chloride 96 - 112 mEq/L 103  CO2 19 - 32 mEq/L 28  Glucose 70 - 99 mg/dL 103 (H)   BUN 6 - 23 mg/dL 8  Creatinine 0.40 - 1.20 mg/dL 0.75  Calcium 8.4 - 10.5 mg/dL 9.3  Alkaline Phosphatase 39 - 117 U/L 53  Albumin 3.5 - 5.2 g/dL 4.4  AST 0 - 37 U/L 14  ALT 0 - 35 U/L 14  Total Protein 6.0 - 8.3 g/dL 6.9  Total Bilirubin 0.2 - 1.2 mg/dL 0.7  GFR >60.00 mL/min 98.92    Latest Reference Range & Units 08/30/21 08:45  Prolactin ng/mL 6.8  Glucose 70 - 99 mg/dL 103 (H)  TSH 0.35 - 5.50 uIU/mL 3.24  T4,Free(Direct) 0.60 - 1.60 ng/dL 0.97      MRI 03/23/2021  FINDINGS: Brain: No acute infarction, hemorrhage, hydrocephalus, or extra-axial collection.   Pituitary/Sella: A hypoenhancing pituitary lesion is again seen measuring approximately 10 x 8 x 4 mm (compared to 11 x 7 x 4 mm on prior). A normal posterior pituitary bright spot is seen. The infundibulum is midline. The hypothalamus and mamillary bodies are normal. There is no mass effect on the optic chiasm or optic nerves. The infundibular and chiasmatic recesses are clear. Normal cavernous sinus and cavernous internal carotid artery flow voids.   Vascular: Normal flow voids.   Skull and upper cervical spine: Normal marrow signal.   Sinuses/Orbits: Trace mucosal thickening throughout the paranasal sinuses. The orbits are maintained.   Other: None.   IMPRESSION: Stable size of pituitary adenoma measuring approximately 11 x 8 x 4 mm. Otherwise, unremarkable MRI of brain.     ASSESSMENT / PLAN / RECOMMENDATIONS:   Pituitary Macroadenoma:    - 1.1 cm on MRI from 06/2019, with decrease size in 12/2019 and stable 02/2021 - No local symptoms of pituitary macroadenoma - Pt had a visual field testing 10/2020 with NO defect - TFT, cortisol, and IGF-1  levels are normal 2021    2.  Prolactinoma   - Tolerating cabergoline  - Repeat prolactin is normal  - No changes     Medications : Cabergoline 0.5 mg, Half a tablet  Once weekly    F/U in 1 year  Signed electronically by: Mack Guise, MD  [facility] Endocrinology  [address] [place], [place] [address] Phone: [phone] FAX: [phone]      CC: Donald Prose, MD [place]  [place] [address] Phone: [phone]  Fax: [phone]   Return to Endocrinology clinic as below: Future Appointments  Date Time Provider [place]  09/04/2022  8:30 AM Pritika Alvarez, Melanie Crazier, MD [id number] None

## 2022-09-04 ENCOUNTER — Encounter: Payer: Self-pay | Admitting: Internal Medicine

## 2022-09-04 ENCOUNTER — Ambulatory Visit: Payer: BC Managed Care – PPO | Admitting: Internal Medicine

## 2022-09-04 VITALS — BP 114/70 | HR 81 | Ht 64.0 in | Wt 200.3 lb

## 2022-09-04 DIAGNOSIS — D352 Benign neoplasm of pituitary gland: Secondary | ICD-10-CM

## 2022-09-04 LAB — CORTISOL: Cortisol, Plasma: 7.6 ug/dL

## 2022-09-04 LAB — TSH: TSH: 5.98 u[IU]/mL — ABNORMAL HIGH (ref 0.35–5.50)

## 2022-09-04 LAB — T4, FREE: Free T4: 0.84 ng/dL (ref 0.60–1.60)

## 2022-09-05 ENCOUNTER — Telehealth: Payer: Self-pay | Admitting: Internal Medicine

## 2022-09-05 MED ORDER — CABERGOLINE 0.5 MG PO TABS: 0.5000 mg | ORAL_TABLET | ORAL | 3 refills | Status: DC

## 2022-09-05 NOTE — Telephone Encounter (Signed)
Please let the patient know that her prolactin level has increased, please ask the patient to increase cabergoline from half a tablet once weekly to 1 full tablet once weekly   Her thyroid is slightly off, but not to the degree to be concerned about as most likely this will return to normal by next checkup

## 2022-09-05 NOTE — Telephone Encounter (Signed)
Patient advised and will make medication changes  

## 2022-09-09 LAB — PROLACTIN: Prolactin: 38.1 ng/mL — ABNORMAL HIGH

## 2022-09-09 LAB — ACTH: C206 ACTH: 31 pg/mL (ref 6–50)

## 2023-03-01 ENCOUNTER — Encounter: Payer: Self-pay | Admitting: Internal Medicine

## 2023-03-01 ENCOUNTER — Ambulatory Visit: Payer: BC Managed Care – PPO | Admitting: Internal Medicine

## 2023-03-01 VITALS — BP 110/72 | HR 68 | Ht 64.0 in | Wt 198.0 lb

## 2023-03-01 DIAGNOSIS — D352 Benign neoplasm of pituitary gland: Secondary | ICD-10-CM | POA: Diagnosis not present

## 2023-03-01 LAB — COMPREHENSIVE METABOLIC PANEL
ALT: 13 U/L (ref 0–35)
AST: 14 U/L (ref 0–37)
Albumin: 4.1 g/dL (ref 3.5–5.2)
Alkaline Phosphatase: 48 U/L (ref 39–117)
BUN: 9 mg/dL (ref 6–23)
CO2: 25 meq/L (ref 19–32)
Calcium: 9.2 mg/dL (ref 8.4–10.5)
Chloride: 107 meq/L (ref 96–112)
Creatinine, Ser: 0.72 mg/dL (ref 0.40–1.20)
GFR: 102.8 mL/min (ref >60.00)
Glucose, Bld: 97 mg/dL (ref 70–99)
Potassium: 3.9 mEq/L (ref 3.5–5.1)
Sodium: 138 meq/L (ref 135–145)
Total Bilirubin: 0.5 mg/dL (ref 0.2–1.2)
Total Protein: 7.3 g/dL (ref 6.0–8.3)

## 2023-03-01 LAB — TSH: TSH: 4.58 u[IU]/mL (ref 0.35–5.50)

## 2023-03-01 LAB — T4, FREE: Free T4: 0.98 ng/dL (ref 0.60–1.60)

## 2023-03-01 NOTE — Progress Notes (Unsigned)
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 She has followed with [facility] since 09/10/2019  for consultative assistance with management of her pituitary adenoma.     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   She is on Cabergoline 0.5 mg , 1 tablet weekly   Weight remains stable Last eye exam 5/202 She has noted minor visual changes at night which was attributed to age Stable chronic  headaches Denies nausea , has noted some constipation  No galactorrhea  Denies palpitations  She has an IUD with monthly long menstruations , on lysteda    HISTORY:  Past Medical History:  Past Medical History:  Diagnosis Date   Asthma    Headache    Hypothyroidism    Neuromuscular disorder ([facility])    scoliosis   Past Surgical History:  Past Surgical History:  Procedure Laterality Date   NO PAST SURGERIES     Social History:  reports that she has never smoked. She uses smokeless tobacco. She reports that she does not drink alcohol and does not use drugs. Family History:  Family History  Problem Relation Age of Onset   Thyroid disease Mother    Breast cancer Paternal Aunt    Breast  cancer Paternal Aunt    Alcohol abuse Neg Hx    Arthritis Neg Hx    Asthma Neg Hx    Birth defects Neg Hx    Cancer Neg Hx    COPD Neg Hx    Depression Neg Hx    Diabetes Neg Hx    Drug abuse Neg Hx    Early death Neg Hx    Hearing loss Neg Hx    Heart disease Neg Hx    Hyperlipidemia Neg Hx    Hypertension Neg Hx    Kidney disease Neg Hx    Learning disabilities Neg Hx    Mental illness Neg Hx    Mental retardation Neg Hx    Miscarriages / Stillbirths Neg Hx    Stroke Neg Hx    Vision loss Neg Hx    Varicose Veins Neg Hx      HOME MEDICATIONS: Allergies as of 03/01/2023       Reactions   Latex Hives        Medication List        Accurate as of March 01, 2023  1:07 PM. If you have any questions, ask your nurse or doctor.          STOP taking these medications    tranexamic acid 650 MG Tabs tablet Commonly known as: LYSTEDA Stopped by: Johnney Ou Makaylynn Bonillas  TAKE these medications    cabergoline 0.5 MG tablet Commonly known as: DOSTINEX Take 1 tablet (0.5 mg total) by mouth once a week.   ELDERBERRY PO Take by mouth.   VITAMIN E COMPLETE PO Take by mouth.          OBJECTIVE:   PHYSICAL EXAM: VS: BP 110/72 (BP Location: Left Arm, Patient Position: Sitting, Cuff Size: Large)   Pulse 68   Ht 5\' 4"  (1.626 m)   Wt 198 lb (89.8 kg)   SpO2 98%   BMI 33.99 kg/m    EXAM: General: Pt appears well and is in NAD  Lungs: Clear with good BS bilat   Heart: Auscultation: RRR.  Abdomen:  soft, nontender  Extremities:  BL LE: No pretibial edema   Mental Status: Judgment, insight: Intact Orientation: Oriented to time, place, and person Mood and affect: No depression, anxiety, or agitation     DATA REVIEWED:    Latest Reference Range & Units 09/04/22 08:44  C206 ACTH 6 - 50 pg/mL 31  Cortisol, Plasma ug/dL 7.6   MRI 6/96/2952  FINDINGS: Brain: No acute infarction, hemorrhage, hydrocephalus, or extra-axial collection.    Pituitary/Sella: A hypoenhancing pituitary lesion is again seen measuring approximately 10 x 8 x 4 mm (compared to 11 x 7 x 4 mm on prior). A normal posterior pituitary bright spot is seen. The infundibulum is midline. The hypothalamus and mamillary bodies are normal. There is no mass effect on the optic chiasm or optic nerves. The infundibular and chiasmatic recesses are clear. Normal cavernous sinus and cavernous internal carotid artery flow voids.   Vascular: Normal flow voids.   Skull and upper cervical spine: Normal marrow signal.   Sinuses/Orbits: Trace mucosal thickening throughout the paranasal sinuses. The orbits are maintained.   Other: None.   IMPRESSION: Stable size of pituitary adenoma measuring approximately 11 x 8 x 4 mm. Otherwise, unremarkable MRI of brain.     ASSESSMENT / PLAN / RECOMMENDATIONS:   Pituitary Macroadenoma:    - 1.1 cm on MRI from 06/2019, with decrease size in 12/2019 and stable 02/2021, will repeat again this year - No local symptoms of pituitary macroadenoma - Pt had a visual field testing 5/202 with NO defect - TFT, cortisol, and IGF-1  levels are normal 2021  -ACTH cortisol normal 2023    2.  Prolactinoma   - Tolerating cabergoline  -We have attempted to decrease cabergoline in 2023 but this resulted in an increased prolactin level - Repeat prolactin****  Medications : Continue cabergoline 0.5 mg, 1 tablet  Once weekly   3.  Subclinical Hypothyroidism:  -Patient is clinically euthyroid -***   F/U in 1 yr  Signed electronically by: Lyndle Herrlich, MD  [facility] Endocrinology  [facility] [address] [place] [place], [place] [address] Phone: [phone] FAX: [phone]      CC: Deatra James, MD [address] [place] [address] Phone: [phone]  Fax: [phone]   Return to Endocrinology clinic as below: No future appointments.

## 2023-03-04 MED ORDER — CABERGOLINE 0.5 MG PO TABS: 0.5000 mg | ORAL_TABLET | ORAL | 3 refills | Status: DC

## 2023-03-09 LAB — PROLACTIN: Prolactin: 11.7 ng/mL

## 2023-03-09 LAB — INSULIN-LIKE GROWTH FACTOR
IGF-I, LC/MS: 84 ng/mL (ref 52–328)
Z-Score (Female): -1.1 {STDV} (ref ?–2.0)

## 2023-03-14 ENCOUNTER — Ambulatory Visit: Payer: BC Managed Care – PPO | Admitting: Internal Medicine

## 2023-03-19 ENCOUNTER — Ambulatory Visit
Admission: RE | Admit: 2023-03-19 | Discharge: 2023-03-19 | Disposition: A | Discharge status: Home / Self Care | Payer: BC Managed Care – PPO | Source: Ambulatory Visit | Attending: Internal Medicine | Admitting: Internal Medicine

## 2023-03-19 DIAGNOSIS — D352 Benign neoplasm of pituitary gland: Secondary | ICD-10-CM

## 2023-03-19 MED ORDER — GADOPICLENOL 0.5 MMOL/ML IV SOLN
9.0000 mL | Freq: Once | INTRAVENOUS | Status: AC | PRN
  Administered 2023-03-19: 9 mL via INTRAVENOUS

## 2023-10-17 ENCOUNTER — Other Ambulatory Visit: Payer: Self-pay | Admitting: Family Medicine

## 2023-10-17 DIAGNOSIS — Z1231 Encounter for screening mammogram for malignant neoplasm of breast: Secondary | ICD-10-CM

## 2023-10-18 ENCOUNTER — Ambulatory Visit
Admission: RE | Admit: 2023-10-18 | Discharge: 2023-10-18 | Disposition: A | Discharge status: Home / Self Care | Source: Ambulatory Visit | Attending: Family Medicine | Admitting: Family Medicine

## 2023-10-18 DIAGNOSIS — Z1231 Encounter for screening mammogram for malignant neoplasm of breast: Secondary | ICD-10-CM

## 2023-11-23 IMAGING — MG MM DIGITAL SCREENING BILAT W/ TOMO AND CAD
8 series · 8 of 24 positions shown · non-contrast
Comparison: Previous exam(s).

CLINICAL DATA: Screening.

EXAM:
DIGITAL SCREENING BILATERAL MAMMOGRAM WITH TOMOSYNTHESIS AND CAD
TECHNIQUE: Bilateral screening digital craniocaudal and mediolateral oblique
mammograms were obtained. Bilateral screening digital breast
tomosynthesis was performed. The images were evaluated with
computer-aided detection.

[L MLO synth-2D]
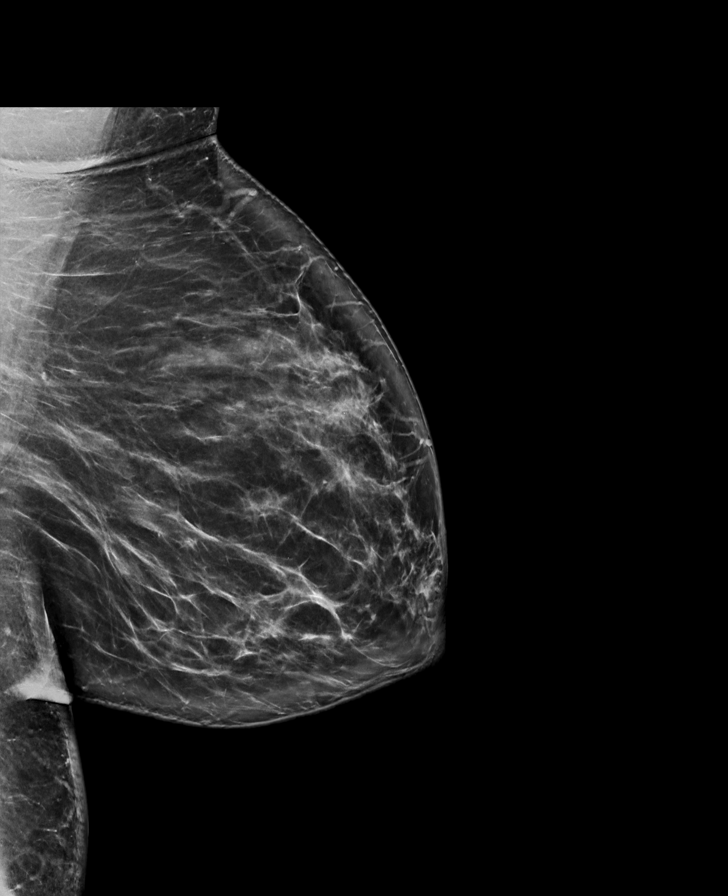

[R CC synth-2D]
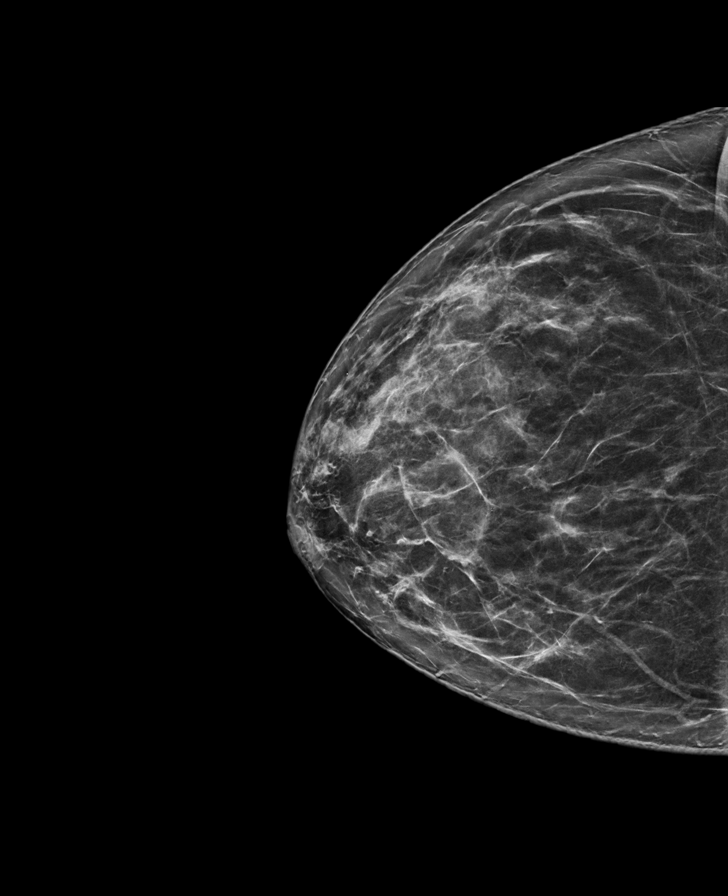

[R MLO synth-2D]
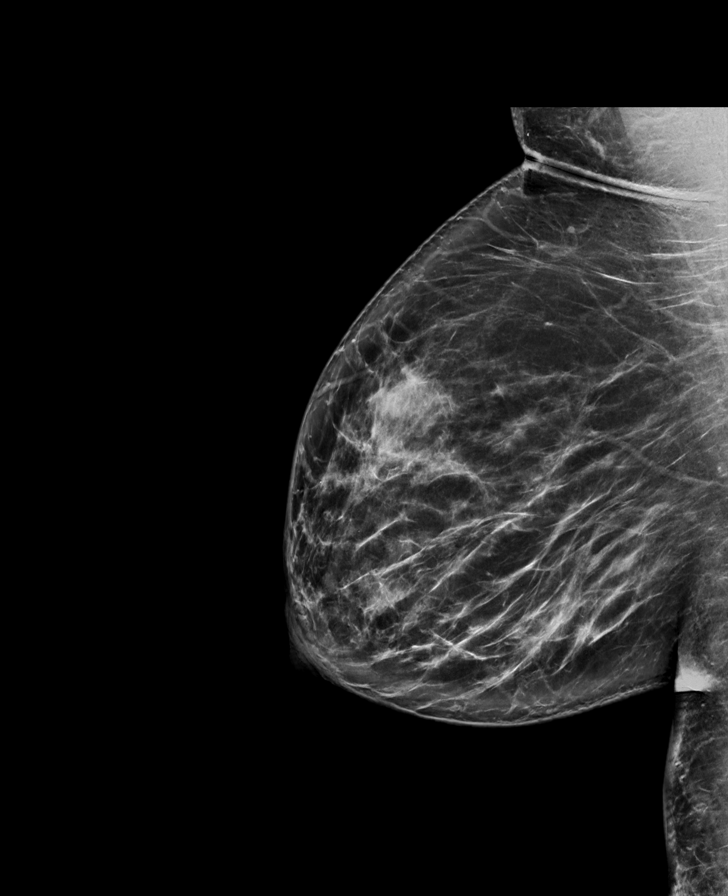

[L CC synth-2D]
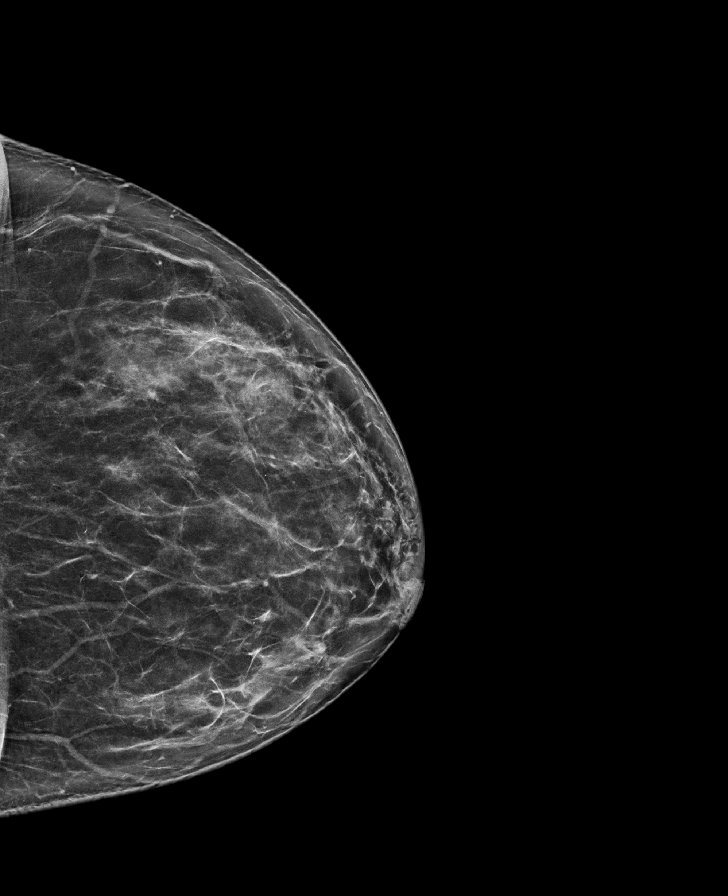

[L CC tomo · tomo slice 42/83.0]
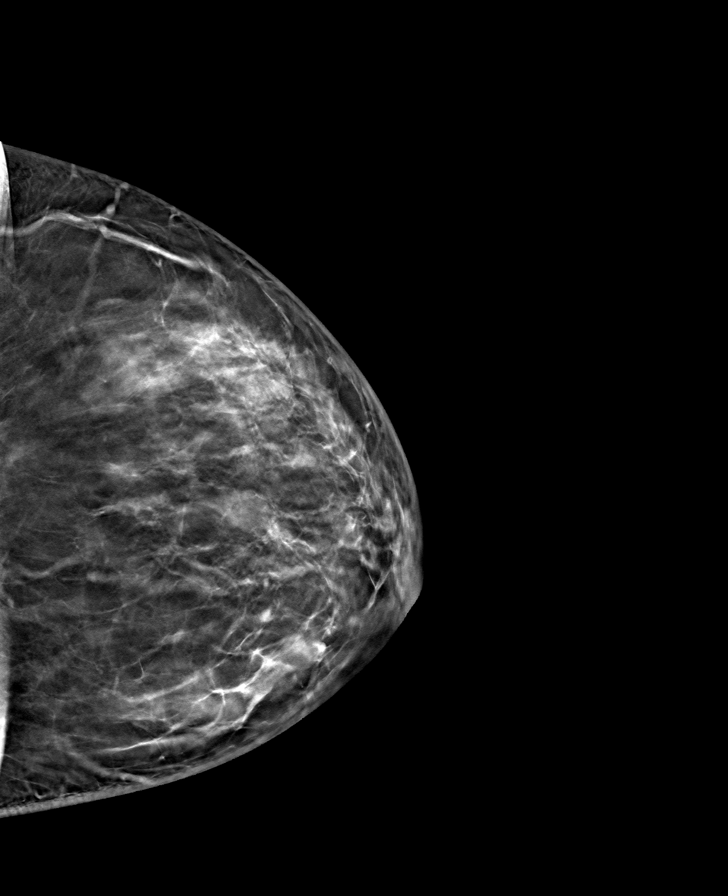

[R CC tomo · tomo slice 41/80.0]
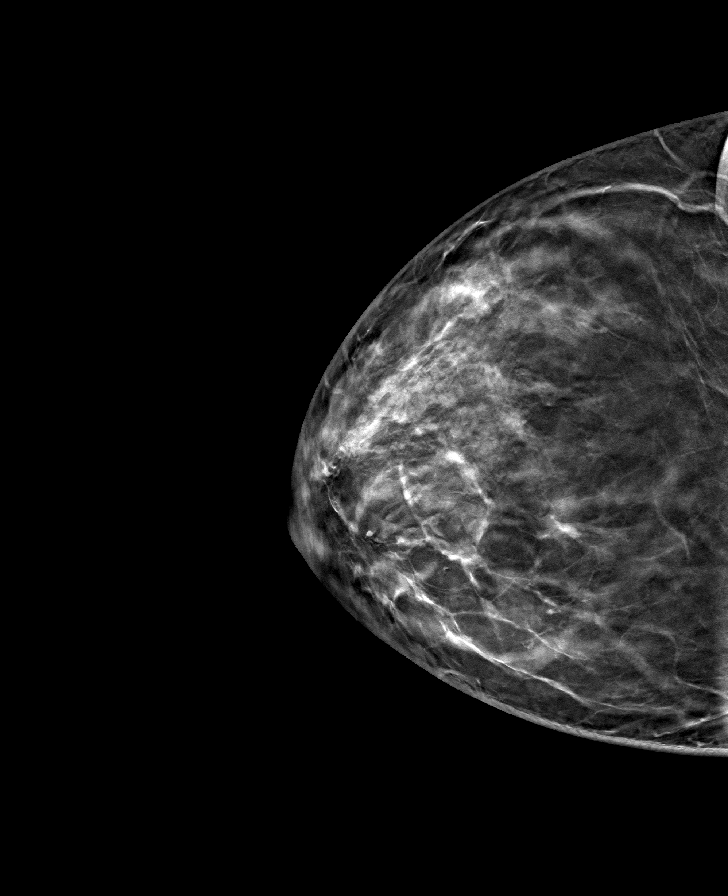

[R MLO tomo · tomo slice 46/91.0]
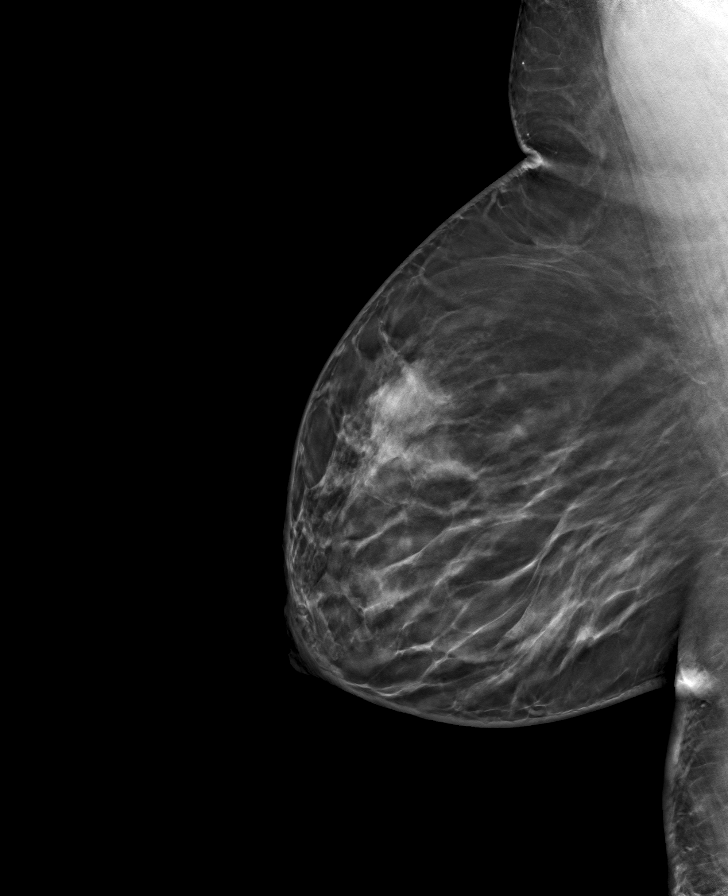

[L MLO tomo · tomo slice 46/91.0]
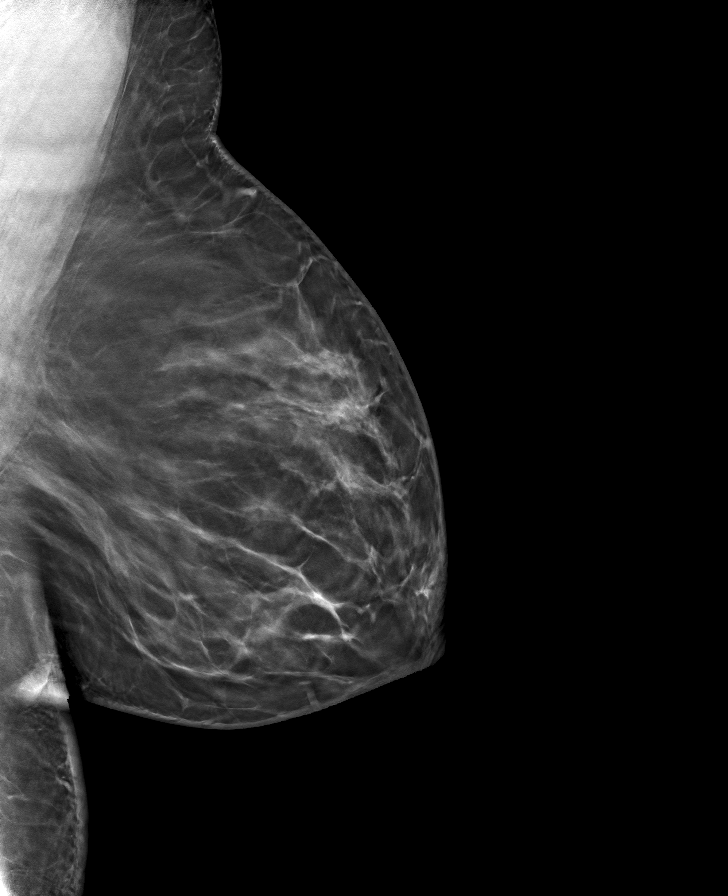

[8 of 24 positions shown; findings below may reference images not displayed]

ACR Breast Density Category c: The breast tissue is heterogeneously
dense, which may obscure small masses.
FINDINGS: There are no findings suspicious for malignancy.
IMPRESSION: No mammographic evidence of malignancy. A result letter of this
screening mammogram will be mailed directly to the patient.

RECOMMENDATION:
Screening mammogram in one year. (Code:Q3-W-BC3)

BI-RADS CATEGORY  1: Negative.

## 2024-03-02 ENCOUNTER — Ambulatory Visit (INDEPENDENT_AMBULATORY_CARE_PROVIDER_SITE_OTHER): Payer: BC Managed Care – PPO | Admitting: Internal Medicine

## 2024-03-02 ENCOUNTER — Encounter: Payer: Self-pay | Admitting: Internal Medicine

## 2024-03-02 VITALS — BP 110/70 | HR 72 | Ht 64.0 in | Wt 193.8 lb

## 2024-03-02 DIAGNOSIS — D352 Benign neoplasm of pituitary gland: Secondary | ICD-10-CM

## 2024-03-02 DIAGNOSIS — E038 Other specified hypothyroidism: Secondary | ICD-10-CM

## 2024-03-02 NOTE — Progress Notes (Unsigned)
 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 She has followed with [facility] since 09/10/2019  for consultative assistance with management of her pituitary adenoma.     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   She is on Cabergoline  0.5 mg , 1 tablet weekly ( Wednesday)  Patient continues to be on Lysteda for prolonged menstruations, patient is considering removal of IUD as she is planning to conceive.  She is G46P34 (has 17-year-old daughter)  Weight continues to fluctuate, with recent weight loss Last eye exam 11/25/2023 Continues with chronic stable headache Stable chronic  headaches Denies nausea  Denies constipation or diarrhea  No galactorrhea  Denies palpitations     HISTORY:  Past Medical History:  Past Medical History:  Diagnosis Date   Asthma    Headache    Hypothyroidism    Neuromuscular disorder ([facility])    scoliosis   Past Surgical History:  Past Surgical History:  Procedure Laterality Date   NO PAST SURGERIES     Social History:  reports that she has never smoked. She uses smokeless tobacco. She reports that she does not drink alcohol and does not use  drugs. Family History:  Family History  Problem Relation Age of Onset   Thyroid  disease Mother    Breast cancer Paternal Aunt 67 - 79   Breast cancer Other    Alcohol abuse Neg Hx    Arthritis Neg Hx    Asthma Neg Hx    Birth defects Neg Hx    Cancer Neg Hx    COPD Neg Hx    Depression Neg Hx    Diabetes Neg Hx    Drug abuse Neg Hx    Early death Neg Hx    Hearing loss Neg Hx    Heart disease Neg Hx    Hyperlipidemia Neg Hx    Hypertension Neg Hx    Kidney disease Neg Hx    Learning disabilities Neg Hx    Mental illness Neg Hx    Mental retardation Neg Hx    Miscarriages / Stillbirths Neg Hx    Stroke Neg Hx    Vision loss Neg Hx    Varicose Veins Neg Hx      HOME MEDICATIONS: Allergies as of 03/02/2024       Reactions   Latex Hives        Medication List        Accurate as of March 02, 2024  8:34 AM. If you have any questions, ask your nurse or doctor.          cabergoline  0.5 MG tablet Commonly known as: DOSTINEX  Take 1 tablet (  0.5 mg total) by mouth once a week.   ELDERBERRY PO Take by mouth.   VITAMIN E COMPLETE PO Take by mouth.          OBJECTIVE:   PHYSICAL EXAM: VS: BP 110/70 (BP Location: Left Arm, Patient Position: Sitting, Cuff Size: Normal)   Pulse 72   Ht 5' 4 (1.626 m)   Wt 193 lb 12.8 oz (87.9 kg)   SpO2 95%   BMI 33.27 kg/m    EXAM: General: Pt appears well and is in NAD  Lungs: Clear with good BS bilat   Heart: Auscultation: RRR.  Abdomen:  soft, nontender  Extremities:  BL LE: No pretibial edema   Mental Status: Judgment, insight: Intact Orientation: Oriented to time, place, and person Mood and affect: No depression, anxiety, or agitation     DATA REVIEWED:     Latest Reference Range & Units 03/02/24 08:47  Sodium 135 - 146 mmol/L 138  Potassium 3.5 - 5.3 mmol/L 4.0  Chloride 98 - 110 mmol/L 104  CO2 20 - 32 mmol/L 29  Glucose 65 - 99 mg/dL 99  BUN 7 - 25 mg/dL 12  Creatinine 9.49 - 9.00 mg/dL  9.23  Calcium  8.6 - 10.2 mg/dL 9.1  BUN/Creatinine Ratio 6 - 22 (calc) SEE NOTE:  eGFR > OR = 60 mL/min/1.32m2 100    Latest Reference Range & Units 03/02/24 08:47  Prolactin ng/mL 5.1  Glucose 65 - 99 mg/dL 99  TSH mIU/L 5.07 (H)  T4,Free(Direct) 0.8 - 1.8 ng/dL 1.5  (H): Data is abnormally high   Latest Reference Range & Units 09/04/22 08:44  C206 ACTH  6 - 50 pg/mL 31  Cortisol, Plasma ug/dL 7.6   MRI 0/75/7975  COMPARISON:  Brain MRI 03/23/2021   FINDINGS: Brain: There is no acute intracranial hemorrhage, extra-axial fluid collection, or acute infarct.   Parenchymal volume is normal. The ventricles are normal in size. Parenchymal signal is normal.   There is no abnormal parenchymal enhancement or mass lesion. There is no mass effect or midline shift.   Pituitary/Sella: There is a 0.7 cm x 0.4 cm CC x 0.7 cm AP hypoenhancing lesion along the floor of the sella, likely not significantly changed since 2022 (24-6). There is no suprasellar extent. The infundibulum is midline. There is no evidence of cavernous sinus invasion.   Vascular: Normal flow voids.   Skull and upper cervical spine: Normal marrow signal.   Sinuses/Orbits: The paranasal sinuses are essentially clear. The globes and orbits are unremarkable.   Other: The mastoid air cells and middle ear cavities are clear.   IMPRESSION: Unchanged 0.7 cm pituitary adenoma.    ASSESSMENT / PLAN / RECOMMENDATIONS:   Pituitary Macroadenoma:    - 1.1 cm on MRI from 06/2019, with decrease size in 12/2019, stable 02/2021, and 2024 -No local symptoms -Up-to-date on visual field testing -Historically ACTH , cortisol, IGF have been normal    2.  Prolactinoma    -We have attempted to decrease cabergoline  in 2023 but this resulted in an increased prolactin level -Patient advised to discontinue prolactin with a positive pregnancy test - Prolactin is at the lower end of normal, I will decrease cabergoline  as  below  Medications : Decrease cabergoline  0.5 mg, half tablet  Once weekly    3. Subclinical Hypothyroidism:  - Patient is clinically euthyroid - TSH continues to fluctuate with normal free T4 - No intervention at this time, will continue to monitor      Signed electronically by:  Abby Redgie Butts, MD  [facility] Endocrinology  [facility] [address] [place], [place] [address] Phone: [phone] FAX: [phone]      CC: Austin Nutley, MD [address] [place] [address] Phone: [phone]  Fax: [phone]   Return to Endocrinology clinic as below: No future appointments.

## 2024-03-03 ENCOUNTER — Ambulatory Visit: Payer: Self-pay | Admitting: Internal Medicine

## 2024-03-03 DIAGNOSIS — E038 Other specified hypothyroidism: Secondary | ICD-10-CM | POA: Insufficient documentation

## 2024-03-03 MED ORDER — CABERGOLINE 0.5 MG PO TABS: 0.2500 mg | ORAL_TABLET | ORAL | 3 refills | Status: DC

## 2024-03-06 LAB — INSULIN-LIKE GROWTH FACTOR
IGF-I, LC/MS: 123 ng/mL (ref 52–328)
Z-Score (Female): -0.3 {STDV} (ref ?–2.0)

## 2024-03-06 LAB — PROLACTIN: Prolactin: 5.1 ng/mL

## 2024-03-06 LAB — CORTISOL: Cortisol, Plasma: 9.6 ug/dL

## 2024-03-06 LAB — BASIC METABOLIC PANEL WITH GFR
BUN: 12 mg/dL (ref 7–25)
CO2: 29 mmol/L (ref 20–32)
Calcium: 9.1 mg/dL (ref 8.6–10.2)
Chloride: 104 mmol/L (ref 98–110)
Creat: 0.76 mg/dL (ref 0.50–0.99)
Glucose, Bld: 99 mg/dL (ref 65–99)
Potassium: 4 mmol/L (ref 3.5–5.3)
Sodium: 138 mmol/L (ref 135–146)
eGFR: 100 mL/min/1.73m2 (ref >=60)

## 2024-03-06 LAB — T4, FREE: Free T4: 1.5 ng/dL (ref 0.8–1.8)

## 2024-03-06 LAB — TSH: TSH: 4.92 m[IU]/L — ABNORMAL HIGH

## 2024-03-06 LAB — ACTH: C206 ACTH: 26 pg/mL (ref 6–50)

## 2024-07-24 ENCOUNTER — Other Ambulatory Visit: Payer: Self-pay | Admitting: Internal Medicine

## 2024-08-17 ENCOUNTER — Encounter: Admitting: Obstetrics and Gynecology

## 2025-03-02 ENCOUNTER — Ambulatory Visit: Admitting: Internal Medicine
